# Patient Record
Sex: Female | Born: 1935 | Race: White | Hispanic: No | Smoking: Current some day smoker
Health system: Southern US, Community
[De-identification: ages and names within clinical notes are randomized; demographics above are authoritative.]

## PROBLEM LIST (undated history)

## (undated) DIAGNOSIS — I2510 Atherosclerotic heart disease of native coronary artery without angina pectoris: Secondary | ICD-10-CM

## (undated) DIAGNOSIS — R079 Chest pain, unspecified: Secondary | ICD-10-CM

## (undated) DIAGNOSIS — I10 Essential (primary) hypertension: Secondary | ICD-10-CM

## (undated) DIAGNOSIS — I480 Paroxysmal atrial fibrillation: Secondary | ICD-10-CM

## (undated) DIAGNOSIS — E46 Unspecified protein-calorie malnutrition: Secondary | ICD-10-CM

## (undated) DIAGNOSIS — I739 Peripheral vascular disease, unspecified: Secondary | ICD-10-CM

## (undated) DIAGNOSIS — Z720 Tobacco use: Secondary | ICD-10-CM

## (undated) DIAGNOSIS — K559 Vascular disorder of intestine, unspecified: Secondary | ICD-10-CM

## (undated) DIAGNOSIS — J4489 Other specified chronic obstructive pulmonary disease: Secondary | ICD-10-CM

## (undated) DIAGNOSIS — J449 Chronic obstructive pulmonary disease, unspecified: Secondary | ICD-10-CM

## (undated) HISTORY — PX: ABDOMINAL HYSTERECTOMY: SHX81

## (undated) HISTORY — DX: Peripheral vascular disease, unspecified: I73.9

## (undated) HISTORY — PX: OTHER SURGICAL HISTORY: SHX169

## (undated) HISTORY — DX: Tobacco use: Z72.0

## (undated) HISTORY — DX: Unspecified protein-calorie malnutrition: E46

## (undated) HISTORY — DX: Chronic obstructive pulmonary disease, unspecified: J44.9

## (undated) HISTORY — DX: Chest pain, unspecified: R07.9

## (undated) HISTORY — DX: Vascular disorder of intestine, unspecified: K55.9

## (undated) HISTORY — DX: Essential (primary) hypertension: I10

## (undated) HISTORY — DX: Other specified chronic obstructive pulmonary disease: J44.89

## (undated) HISTORY — DX: Paroxysmal atrial fibrillation: I48.0

## (undated) HISTORY — DX: Atherosclerotic heart disease of native coronary artery without angina pectoris: I25.10

## (undated) HISTORY — PX: APPENDECTOMY: SHX54

---

## 2008-09-27 ENCOUNTER — Encounter: Payer: Self-pay | Admitting: Cardiology

## 2008-10-01 ENCOUNTER — Encounter: Payer: Self-pay | Admitting: Cardiology

## 2008-10-03 ENCOUNTER — Ambulatory Visit: Payer: Self-pay | Admitting: Cardiology

## 2008-10-03 ENCOUNTER — Ambulatory Visit: Payer: Self-pay | Admitting: Cardiovascular Disease

## 2008-10-03 ENCOUNTER — Inpatient Hospital Stay (HOSPITAL_COMMUNITY): Admission: EM | Admit: 2008-10-03 | Discharge: 2008-10-04 | Payer: Self-pay | Admitting: Emergency Medicine

## 2008-10-03 DIAGNOSIS — I7389 Other specified peripheral vascular diseases: Secondary | ICD-10-CM

## 2008-10-03 DIAGNOSIS — R042 Hemoptysis: Secondary | ICD-10-CM | POA: Insufficient documentation

## 2008-10-03 DIAGNOSIS — R1311 Dysphagia, oral phase: Secondary | ICD-10-CM

## 2008-10-03 DIAGNOSIS — J449 Chronic obstructive pulmonary disease, unspecified: Secondary | ICD-10-CM

## 2008-10-03 DIAGNOSIS — J4489 Other specified chronic obstructive pulmonary disease: Secondary | ICD-10-CM | POA: Insufficient documentation

## 2008-10-03 DIAGNOSIS — I10 Essential (primary) hypertension: Secondary | ICD-10-CM

## 2008-10-03 DIAGNOSIS — K559 Vascular disorder of intestine, unspecified: Secondary | ICD-10-CM | POA: Insufficient documentation

## 2008-10-03 DIAGNOSIS — Z8679 Personal history of other diseases of the circulatory system: Secondary | ICD-10-CM | POA: Insufficient documentation

## 2008-10-03 DIAGNOSIS — F17200 Nicotine dependence, unspecified, uncomplicated: Secondary | ICD-10-CM | POA: Insufficient documentation

## 2008-10-03 DIAGNOSIS — R072 Precordial pain: Secondary | ICD-10-CM | POA: Insufficient documentation

## 2008-10-03 DIAGNOSIS — I200 Unstable angina: Secondary | ICD-10-CM

## 2008-10-04 ENCOUNTER — Encounter: Payer: Self-pay | Admitting: Cardiology

## 2008-10-11 ENCOUNTER — Ambulatory Visit: Payer: Self-pay | Admitting: Physician Assistant

## 2008-10-11 DIAGNOSIS — I2510 Atherosclerotic heart disease of native coronary artery without angina pectoris: Secondary | ICD-10-CM | POA: Insufficient documentation

## 2008-10-11 DIAGNOSIS — E785 Hyperlipidemia, unspecified: Secondary | ICD-10-CM | POA: Insufficient documentation

## 2008-10-16 ENCOUNTER — Ambulatory Visit: Payer: Self-pay | Admitting: Cardiology

## 2008-11-11 ENCOUNTER — Ambulatory Visit: Payer: Self-pay | Admitting: Cardiology

## 2008-11-20 ENCOUNTER — Telehealth (INDEPENDENT_AMBULATORY_CARE_PROVIDER_SITE_OTHER): Payer: Self-pay | Admitting: *Deleted

## 2009-01-06 ENCOUNTER — Encounter: Payer: Self-pay | Admitting: Cardiovascular Disease

## 2009-01-07 ENCOUNTER — Ambulatory Visit: Payer: Self-pay | Admitting: Cardiovascular Disease

## 2009-01-07 ENCOUNTER — Encounter: Payer: Self-pay | Admitting: Cardiovascular Disease

## 2009-01-07 ENCOUNTER — Ambulatory Visit: Payer: Self-pay

## 2009-01-31 ENCOUNTER — Telehealth (INDEPENDENT_AMBULATORY_CARE_PROVIDER_SITE_OTHER): Payer: Self-pay | Admitting: *Deleted

## 2009-02-04 ENCOUNTER — Ambulatory Visit: Payer: Self-pay | Admitting: Cardiovascular Disease

## 2009-02-04 ENCOUNTER — Encounter: Payer: Self-pay | Admitting: Cardiovascular Disease

## 2009-04-14 ENCOUNTER — Ambulatory Visit: Payer: Self-pay | Admitting: Cardiology

## 2009-06-03 ENCOUNTER — Encounter: Payer: Self-pay | Admitting: Cardiology

## 2009-09-01 ENCOUNTER — Encounter: Payer: Self-pay | Admitting: Cardiology

## 2009-09-24 ENCOUNTER — Ambulatory Visit: Payer: Self-pay

## 2009-09-24 ENCOUNTER — Ambulatory Visit: Payer: Self-pay | Admitting: Cardiovascular Disease

## 2009-10-22 ENCOUNTER — Ambulatory Visit: Payer: Self-pay | Admitting: Cardiology

## 2009-10-22 DIAGNOSIS — R0602 Shortness of breath: Secondary | ICD-10-CM | POA: Insufficient documentation

## 2009-10-23 ENCOUNTER — Encounter: Payer: Self-pay | Admitting: Cardiology

## 2010-02-17 NOTE — Assessment & Plan Note (Signed)
Summary: [facility]   Visit Type:  Follow-up Primary Provider:  Hasanaj  CC:  No complaints.  History of Present Illness: [name] with h/o triple vessel CAD and severe PVD by angiography 9/10 who also has a history of ongoing tobacco abuse, hyperlipidemia, HTN, COPD, AAA and PAF here today for PV follow up. She sees Dr. Andee Lineman for the mangagement of her CAD. I performed a left heart cath and lower extremity angiogram in September 2009. We opted for medical management at that time of both her CAD and PVD.   She tells me today that she is doing well. Her legs are not hurting her while walking around her house. She has been using a stationary bike in her house and she is able to use this 2-3 times per day with no pain in her legs. No nocturnal cramps or rest pain. No ulcerations. Her feet have been warmer. She continues to smoke 1ppd. She does not wish to stop.   Current Medications (verified): 1)  Nitrostat 0.4 Mg Subl (Nitroglycerin) .... Dissolve One Tablet Under Tongue For Severe Chest Pain As Needed Every 5 Minutes, Not To Exceed 3 in 15 Min Time Frame 2)  Aspirin 81 Mg Tbec (Aspirin) .... Take One Tablet By Mouth Daily 3)  Benadryl 25 Mg Tabs (Diphenhydramine Hcl) .... As Needed 4)  Geritol Complete  Tabs (Iron-Vitamins) .... Take 1 Tablet By Mouth Once A Day 5)  Ensure  Liqd (Nutritional Supplements) .... 2 Cans Daily 6)  Plavix 75 Mg Tabs (Clopidogrel Bisulfate) .... Take One Tablet By Mouth Daily 7)  Isosorbide Mononitrate Cr 60 Mg Xr24h-Tab (Isosorbide Mononitrate) .... Take One Tablet By Mouth Daily 8)  Simvastatin 40 Mg Tabs (Simvastatin) .... Take One Tablet By Mouth Daily At Bedtime 9)  Metoprolol Tartrate 25 Mg Tabs (Metoprolol Tartrate) .... Take One Tablet By Mouth Twice A Day 10)  Omeprazole 40 Mg Cpdr (Omeprazole) .... Take 1 Capsule By Mouth Once A Day  Allergies (verified): No Known Drug Allergies  Past History:  Past Medical History: Paroxysmal atrial  fibrillation Hyperlipidemia HTN CAD PVD Tobacco abuse AAA  Social History: Reviewed history from 10/03/2008 and no changes required. Retired  Alcohol Use - no 1ppd tobacco No iliicit drug use  Review of Systems  The patient denies fatigue, malaise, chest pain, palpitations, shortness of breath, prolonged cough, wheezing, sleep apnea, coughing up blood, abdominal pain, blood in stool, nausea, vomiting, diarrhea, heartburn, incontinence, blood in urine, muscle weakness, joint pain, leg swelling, rash, skin lesions, headache, fainting, dizziness, depression, anxiety, enlarged lymph nodes, easy bruising or bleeding, and environmental allergies.    Vital Signs:  Patient profile:   76 year old female Height:      61.5 inches Weight:      84.50 pounds BMI:     15.76 Pulse rate:   60 / minute Pulse rhythm:   regular Resp:     18 per minute BP sitting:   128 / 64  (left arm) Cuff size:   regular  Vitals Entered By: Vikki Ports (February 04, 2009 1:32 PM)  Physical Exam  General:  General: Well developed, thin, NAD HEENT: OP clear, mucus membranes moist SKIN: warm, dry Psychiatric: Mood and affect normal Neck: No JVD, no carotid bruits Lungs:Clear bilaterally with decreased air movement bilaterally. No wheezes, rhonci, crackles CV: RRR no murmurs, gallops rubs Abdomen: soft, NT, ND, BS present Extremities: No edema, pulses non-palpable bilateral DP/PT.    EKG  Procedure date:  02/04/2009  Findings:      NSR, rate 60 bpm. LAD.   Arteriogram-Lower Extremity  Procedure date:  10/04/2008  Findings:      1.. Distal aortogram shows patent bilateral renal arteries with mild     plaque disease in both renal arteries.  There is no evidence of     distal aortic aneurysm.  There is minimal plaque disease in the     distal aorta. 2. The left common iliac artery has 70% plaque.  The left external     iliac artery has an 80% stenosis.  The left superficial femoral     artery  has a long area of 70% narrowing from the ostium to the     midportion of vessel.  There are serial 90% lesions throughout the     mid SFA and distal SFA.  The left popliteal is patent without any     significant disease.  There is three-vessel runoff into the left     foot.  The left posterior tibial artery vessel has a 90% stenosis     in the mid portion of the vessel. 3. The right common iliac artery has mild plaque disease.  The right     external artery has severe stenosis that is approximately 90%.  The     vessel is subtotally occluded by the sheath.  The right SFA has a     near total occlusion of the proximal portion.  There is a total     occlusion in the midportion of the right SFA with reconstitution of     the distal SFA via collaterals from the profunda femoral artery.     The right popliteal is patent.  There is three-vessel runoff into     the right foot.  Cardiac Cath  Procedure date:  10/04/2008  Findings:      HEMODYNAMIC FINDINGS:  Central aortic pressure 154/59.  Left ventricular pressure 152/1.  Left ventricular end-diastolic pressure 17.  Right atrial pressure 5.  Right ventricular pressure 36/0.  Right ventricular end-diastolic pressure 6.  Pulmonary artery pressure 38/6 with a mean of 20.  Pulmonary capillary wedge pressure 8-10.  Central aortic saturation 91%.  Pulmonary artery saturation 70%.  Cardiac output 5 liters per minute.  Cardiac index 3.82 liters per minute per meter squared.   ANGIOGRAPHIC FINDINGS: 1. Left main coronary artery bifurcated into the circumflex and the     LAD and had no obstructive disease. 2. The LAD is a large vessel that courses to the apex and is free of     any significant disease.  There appears to be plaque in the     proximal portion and then 40% stenosis in the mid and distal     vessel.  The LAD gives off 4 small-caliber diagonal branches.  The     first and second diagonal branches have 70% ostial stenosis, but     are  small in caliber.  The third and fourth diagonals have plaque     disease only.  There is a large septal perforator that has plaque     disease. 3. Circumflex artery is a large-caliber vessel that has 100% total     occlusion in the midportion.  The first obtuse marginal is large     caliber and has no obstructive disease.  Just after the takeoff of     the first obtuse marginal, there is total obstruction.  The AV     groove circumflex and  the second obtuse marginal filled from left-     to-left collaterals. 4. The right coronary artery is a large dominant vessel that has 50%     ostial stenosis, 80% proximal stenosis, and diffuse 90% disease     throughout the midportion of the vessel.  This culminates in a     total occlusion in the distal portion of the vessel.  The PDA and     posterolateral branches fill from left-to-right collaterals. 5. Left ventricular angiogram was performed in the RAO projection and     shows normal left ventricular systolic function with no wall motion    Impression & Recommendations:  Problem # 1:  PVD WITH CLAUDICATION (ICD-443.89) Stable with improvement in symptoms on medical therapy. Continue ASA and Plavix. Continue walking program/exercise. Smoking cessation strongly encouraged. Will repeat ABI and abdominal aortic u/s 6 months and see her back that day. She will alert [place] if her symptoms change in character or if she develops rest pain, ulcerations or blue toes.   Problem # 2:  TOBACCO ABUSE (ICD-305.1) Smoking cessation encouraged. Pt not interested in pharmacological therapy at this time.   Other Orders: EKG w/ Interpretation (93000) Arterial Duplex Lower Extremity (Arterial Duplex Low) Abdominal Aorta Duplex (Abd Aorta Duplex)  Patient Instructions: 1)  Your physician recommends that you schedule a follow-up appointment in: 6 months 2)  Your physician has requested that you have an abdominal aorta duplex. During this test, an ultrasound is used  to evaluate the aorta. Allow 30 minutes for this exam. Do not eat after midnight the day before and avoid carbonated beverages. There are no restrictions or special instructions.  To be done in 6 months 3)  Your physician has requested that you have a lower or upper extremity arterial duplex.  This test is an ultrasound of the arteries in the legs or arms.  It looks at arterial blood flow in the legs and arms.  Allow one hour for Lower and Upper Arterial scans. There are no restrictions or special instructions.  To be done in 6 months

## 2010-02-17 NOTE — Assessment & Plan Note (Signed)
Summary: 6 MO FU REMINDER   Visit Type:  Follow-up Primary Provider:  Hasanaj   History of Present Illness: the patient is a 76 year old white female with history of triple vessel coronary arteries and severe peripheral vascular disease by angiography September 2010. She has multiple risk factors including tobacco abuse, hyperlipidemia hypertension COPD AAA as well as PAF. She has severe disease in both iliac arteries and the bilateral superficial femoral arteries. There were no good percutaneous options. The patient had recent ABIs done 0.6 on the right side and 0.6 on the left side. She reports claudication but has had no worsening of her symptoms.she gets ABIs every 6 months  The patient reports no chest pain or shortness of breath. Shows no orthopnea PND palpitations or syncopethe patient was to discontinue Plavix.  Preventive Screening-Counseling & Management  Alcohol-Tobacco     Smoking Status: current     Smoking Cessation Counseling: yes     Packs/Day: 1 PPD  Current Medications (verified): 1)  Nitrostat 0.4 Mg Subl (Nitroglycerin) .... Dissolve One Tablet Under Tongue For Severe Chest Pain As Needed Every 5 Minutes, Not To Exceed 3 in 15 Min Time Frame 2)  Aspirin 81 Mg Tbec (Aspirin) .... Take One Tablet By Mouth Daily 3)  Benadryl 25 Mg Tabs (Diphenhydramine Hcl) .... As Needed 4)  Ensure  Liqd (Nutritional Supplements) .... 2 Cans Daily 5)  Isosorbide Mononitrate Cr 60 Mg Xr24h-Tab (Isosorbide Mononitrate) .... Take One Tablet By Mouth Daily 6)  Simvastatin 40 Mg Tabs (Simvastatin) .... Take One Tablet By Mouth Daily At Bedtime 7)  Metoprolol Tartrate 25 Mg Tabs (Metoprolol Tartrate) .... Take One Tablet By Mouth Twice A Day  Allergies (verified): No Known Drug Allergies  Comments:  Nurse/Medical Assistant: The patient's medication bottles and allergies were reviewed with the patient and were updated in the Medication and Allergy Lists.  Past History:  Past Surgical  History: Last updated: 10/03/2008 HYSTERECTOMY CYST REMOVAL FROM OVARY  Family History: Last updated: 10/03/2008 Mother: Family History of Diabetes:  Father: Family History of CVA or Stroke:  COPD  Social History: Last updated: 02/04/2009 Retired  Alcohol Use - no 1ppd tobacco No iliicit drug use  Risk Factors: Smoking Status: current (10/22/2009) Packs/Day: 1 PPD (10/22/2009)  Past Medical History: Paroxysmal atrial fibrillation Hyperlipidemia HTN CAD PVD-severe Tobacco abuse AAA ABISeptember 2011 0.67 on the right and 0.69 on the right duplex imaging of aorta: Normal caliber aorta and external iliac arteries with focal elevation moderate left common iliac artery stenosis right external iliac coronary artery not well seen.  Social History: Packs/Day:  1 PPD  Review of Systems       The patient complains of shortness of breath, muscle weakness, and joint pain.    Vital Signs:  Patient profile:   76 year old female Height:      61.5 inches Weight:      92 pounds Pulse rate:   65 / minute BP sitting:   116 / 69  (left arm) Cuff size:   regular  Vitals Entered By: Carlye Grippe (October 22, 2009 12:50 PM)  Physical Exam  Additional Exam:  General: frail white female smelling of tobacco head: Normocephalic and atraumatic eyes PERRLA/EOMI intact, conjunctiva and lids normal nose: No deformity or lesions mouth normal dentition, normal posterior pharynx neck: Supple, no JVD.  No masses, thyromegaly or abnormal cervical nodes. No carotid bruits lungs: Normal breath sounds bilaterally without wheezing.  Normal percussion heart: regular rate and rhythm with normal S1  and S2, no S3 or S4.  PMI is normal.  No pathological murmurs abdomen: Normal bowel sounds, abdomen is soft and nontender without masses, organomegaly or hernias noted.  No hepatosplenomegaly musculoskeletal: Back normal, normal gait muscle strength and tone normal pulsus:absent pulses dorsalis  pedis and posterior tibial pulse bilaterally, decreased capillary refill Extremities: No peripheral pitting edema neurologic: Alert and oriented x 3 skin: Intact without lesions or rashes cervical nodes: No significant adenopathy psychologic: Normal affect    EKG  Procedure date:  10/31/2009  Findings:      normal sinus rhythm left axis deviation possible old inferior wall microinfarction no acute changes.  Impression & Recommendations:  Problem # 1:  SHORTNESS OF BREATH (ICD-786.05) we'll obtain a chest x-ray particular to rule out malignancy. Her updated medication list for this problem includes:    Aspirin 81 Mg Tbec (Aspirin) .Marland Kitchen... Take one tablet by mouth daily    Metoprolol Tartrate 25 Mg Tabs (Metoprolol tartrate) .Marland Kitchen... Take one tablet by mouth twice a day  Orders: T-Chest x-ray, 2 views (71020)  Problem # 2:  CAD (ICD-414.00) patient can discontinue Plavix for now she has difficulty affording this. The following medications were removed from the medication list:    Plavix 75 Mg Tabs (Clopidogrel bisulfate) .Marland Kitchen... Take one tablet by mouth daily Her updated medication list for this problem includes:    Nitrostat 0.4 Mg Subl (Nitroglycerin) .Marland Kitchen... Dissolve one tablet under tongue for severe chest pain as needed every 5 minutes, not to exceed 3 in 15 min time frame    Aspirin 81 Mg Tbec (Aspirin) .Marland Kitchen... Take one tablet by mouth daily    Isosorbide Mononitrate Cr 60 Mg Xr24h-tab (Isosorbide mononitrate) .Marland Kitchen... Take one tablet by mouth daily    Metoprolol Tartrate 25 Mg Tabs (Metoprolol tartrate) .Marland Kitchen... Take one tablet by mouth twice a day  Orders: EKG w/ Interpretation (93000)  Problem # 3:  TOBACCO ABUSE (ICD-305.1) the patient was counseled regarding tobacco use  Problem # 4:  ABDOMINAL AORTIC ANEURYSM (ICD-441.4) patient will require followup in 6 months with an ultrasound  Problem # 5:  PVD WITH CLAUDICATION (ICD-443.89) patient has ongoing claudication but has no  good peripheral interventional options.  Patient Instructions: 1)  Chest x-ray 2)  Stop Plavix  3)  ABI's in 6 months prior to next visit 4)  Follow up in  6 months Prescriptions: ISOSORBIDE MONONITRATE CR 60 MG XR24H-TAB (ISOSORBIDE MONONITRATE) Take one tablet by mouth daily  #30 x 6   Entered by:   Hoover Brunette, LPN   Authorized by:   Lewayne Bunting, MD, [facility]   Signed by:   Hoover Brunette, LPN on 16/10/9602   Method used:   Electronically to        [facility] # [phone]* (retail)       [address]       [place], [place]  [address]       Ph: [phone] or [phone]       Fax: [phone]   RxID:   [id number] METOPROLOL TARTRATE 25 MG TABS (METOPROLOL TARTRATE) Take one tablet by mouth twice a day  #60 x 6   Entered by:   Hoover Brunette, LPN   Authorized by:   Lewayne Bunting, MD, [facility]   Signed by:   Hoover Brunette, LPN on 74/25/9563   Method used:   Electronically to        [facility] # [phone]* (  retail)       [address]       [place], [place]  [address]       Ph: [phone] or [phone]       Fax: [phone]   RxID:   [id number]

## 2010-02-17 NOTE — Medication Information (Signed)
Summary: RX Folder/ PICKED UP [facility]  RX Folder/ PICKED UP [facility]   Imported By: Dorise Hiss 09/01/2009 16:12:28  _____________________________________________________________________  External Attachment:    Type:   Image     Comment:   External Document

## 2010-02-17 NOTE — Assessment & Plan Note (Signed)
Summary: 6 MO FU   Visit Type:  Follow-up Primary Provider:  Hasanaj  CC:  NO CARDIOLOGY COMPLAINTS TODAY.  History of Present Illness: the patient is a 76 year old female with severe multivessel coronary artery disease, severe peripheral vascular disease with claudication, hypertension and dyslipidemia. She was recently evaluated her for peripheral vascular disease. Recommendations were to continue medical therapy and a graded exercise program. As a matter of fact the patient's claudication is significantly improved. She is able to her house chores and daily activities without any symptoms. She reports the 4 episodes in the last month of substernal chest pressure lasting several seconds.she reports no exertional angina.she reports short of breath on moderate exertion which is chronic likely related to her underlying COPD. Unfortunately patient continues to smoke but she got back down to half a pack a day. From a cardiovascular standpoint is otherwise stable and reports no presyncope or syncope and no orthopnea PND. Laboratory work is followed by primary care physician.  Clinical Review Panels:  CXR CXR results  IMPRESSION:   1. Triple-vessel coronary artery disease.   2. Normal left ventricular systolic function.   3. Severe peripheral vascular disease.      RECOMMENDATIONS:  I recommend medical management for the patient's   coronary artery disease and peripheral vascular disease for now.  I will   have her begin taking an aspirin as well as Plavix.  We will likely   discharge her from the hospital later today after her bedrest.  This   patient has extensive coronary artery disease, but does have good   collateralization of the distal vessels.  Her LAD is patent without any   obstructive disease.  Her left ventricular systolic function is normal   at this time.  I think that she would do well with medical management.   In regards to her peripheral vascular disease, we will have  surveillance   ultrasounds and will follow her symptomatically.  If she begins to   develop any rest pain or ulcerations, then we would consider   revascularization of her lower extremities at that time.               Verne Carrow, MD   Electronically Signed     (10/03/2008)  Vascular Studies ABI's Impressions: Known multi-level arterial disease. The bilateral ABI's are in the moderate range.  Tonny Bollman, MD (01/07/2009)  Cardiac Imaging Cardiac Cath Findings  IMPRESSION:   1. Triple-vessel coronary artery disease.   2. Normal left ventricular systolic function.   3. Severe peripheral vascular disease.      RECOMMENDATIONS:  I recommend medical management for the patient's   coronary artery disease and peripheral vascular disease for now.  I will   have her begin taking an aspirin as well as Plavix.  We will likely   discharge her from the hospital later today after her bedrest.  This   patient has extensive coronary artery disease, but does have good   collateralization of the distal vessels.  Her LAD is patent without any   obstructive disease.  Her left ventricular systolic function is normal   at this time.  I think that she would do well with medical management.   In regards to her peripheral vascular disease, we will have surveillance   ultrasounds and will follow her symptomatically.  If she begins to   develop any rest pain or ulcerations, then we would consider   revascularization of her lower extremities at that time.  Verne Carrow, MD   Electronically Signed     (10/04/2008)    Current Medications (verified): 1)  Nitrostat 0.4 Mg Subl (Nitroglycerin) .... Dissolve One Tablet Under Tongue For Severe Chest Pain As Needed Every 5 Minutes, Not To Exceed 3 in 15 Min Time Frame 2)  Aspirin 81 Mg Tbec (Aspirin) .... Take One Tablet By Mouth Daily 3)  Benadryl 25 Mg Tabs (Diphenhydramine Hcl) .... As Needed 4)  Ensure  Liqd  (Nutritional Supplements) .... 2 Cans Daily 5)  Plavix 75 Mg Tabs (Clopidogrel Bisulfate) .... Take One Tablet By Mouth Daily 6)  Isosorbide Mononitrate Cr 60 Mg Xr24h-Tab (Isosorbide Mononitrate) .... Take One Tablet By Mouth Daily 7)  Simvastatin 40 Mg Tabs (Simvastatin) .... Take One Tablet By Mouth Daily At Bedtime 8)  Metoprolol Tartrate 25 Mg Tabs (Metoprolol Tartrate) .... Take One Tablet By Mouth Twice A Day 9)  Omeprazole 40 Mg Cpdr (Omeprazole) .... Take 1 Capsule By Mouth Once A Day  Allergies (verified): No Known Drug Allergies  Past History:  Past Surgical History: Last updated: 10/03/2008 HYSTERECTOMY CYST REMOVAL FROM OVARY  Family History: Last updated: 10/03/2008 Mother: Family History of Diabetes:  Father: Family History of CVA or Stroke:  COPD  Social History: Last updated: 02/04/2009 Retired  Alcohol Use - no 1ppd tobacco No iliicit drug use  Risk Factors: Smoking Status: current (11/11/2008) Packs/Day: 1/2 PPD (11/11/2008)  Past Medical History: Reviewed history from 02/04/2009 and no changes required. Paroxysmal atrial fibrillation Hyperlipidemia HTN CAD PVD Tobacco abuse AAA  Review of Systems       The patient complains of shortness of breath and prolonged cough.  The patient denies fatigue, malaise, fever, weight gain/loss, vision loss, decreased hearing, hoarseness, chest pain, palpitations, wheezing, sleep apnea, coughing up blood, abdominal pain, blood in stool, nausea, vomiting, diarrhea, heartburn, incontinence, blood in urine, muscle weakness, joint pain, leg swelling, rash, skin lesions, headache, fainting, dizziness, depression, anxiety, enlarged lymph nodes, easy bruising or bleeding, and environmental allergies.    Vital Signs:  Patient profile:   76 year old female Weight:      89 pounds Pulse rate:   62 / minute BP sitting:   136 / 72  (left arm)  Vitals Entered By: Dreama Saa, CNA (April 14, 2009 8:50 AM)  Physical  Exam  Additional Exam:  General: frail white female smelling of tobacco head: Normocephalic and atraumatic eyes PERRLA/EOMI intact, conjunctiva and lids normal nose: No deformity or lesions mouth normal dentition, normal posterior pharynx neck: Supple, no JVD.  No masses, thyromegaly or abnormal cervical nodes. No carotid bruits lungs: Normal breath sounds bilaterally without wheezing.  Normal percussion heart: regular rate and rhythm with normal S1 and S2, no S3 or S4.  PMI is normal.  No pathological murmurs abdomen: Normal bowel sounds, abdomen is soft and nontender without masses, organomegaly or hernias noted.  No hepatosplenomegaly musculoskeletal: Back normal, normal gait muscle strength and tone normal pulsus:absent pulses dorsalis pedis and posterior tibial pulse bilaterally, decreased capillary refill Extremities: No peripheral pitting edema neurologic: Alert and oriented x 3 skin: Intact without lesions or rashes cervical nodes: No significant adenopathy psychologic: Normal affect    Impression & Recommendations:  Problem # 1:  TOBACCO ABUSE (ICD-305.1) I counseled the patient extensively regarding her tobacco use. I proposed Chantix which is unable to afford this. Again suggested to her to buy an electronic cigarette and her daughter state that she will look into that. The patient has cut down her smoking  to half a pack a day in the meanwhile.  Problem # 2:  PVD WITH CLAUDICATION (ICD-443.89) the patient is followed by Dr. Sanjuana Kava. Further medical therapy I suggested for her PVD. The patient uses a stationary bicycle and has had significant improvement in her symptoms of claudication or  Problem # 3:  CAD (ICD-414.00) stable no recurrent chest pain. Her updated medication list for this problem includes:    Nitrostat 0.4 Mg Subl (Nitroglycerin) .Marland Kitchen... Dissolve one tablet under tongue for severe chest pain as needed every 5 minutes, not to exceed 3 in 15 min time frame     Aspirin 81 Mg Tbec (Aspirin) .Marland Kitchen... Take one tablet by mouth daily    Plavix 75 Mg Tabs (Clopidogrel bisulfate) .Marland Kitchen... Take one tablet by mouth daily    Isosorbide Mononitrate Cr 60 Mg Xr24h-tab (Isosorbide mononitrate) .Marland Kitchen... Take one tablet by mouth daily    Metoprolol Tartrate 25 Mg Tabs (Metoprolol tartrate) .Marland Kitchen... Take one tablet by mouth twice a day  Problem # 4:  COPD (ICD-496) the patient was again counseled to discontinue tobacco use.  Problem # 5:  ATRIAL FIBRILLATION, PAROXYSMAL, HX OF (ICD-V12.50) no recurrent arrhythmias.  Problem # 6:  CAD (ICD-414.00) the patient reports several episodes of atypical chest pain lasting seconds. Clinically does not appear to represent angina. Continue risk factor modification. I gave the patient refills on isosorbide mononitrate and metoprolol. Her updated medication list for this problem includes:    Nitrostat 0.4 Mg Subl (Nitroglycerin) .Marland Kitchen... Dissolve one tablet under tongue for severe chest pain as needed every 5 minutes, not to exceed 3 in 15 min time frame    Aspirin 81 Mg Tbec (Aspirin) .Marland Kitchen... Take one tablet by mouth daily    Plavix 75 Mg Tabs (Clopidogrel bisulfate) .Marland Kitchen... Take one tablet by mouth daily    Isosorbide Mononitrate Cr 60 Mg Xr24h-tab (Isosorbide mononitrate) .Marland Kitchen... Take one tablet by mouth daily    Metoprolol Tartrate 25 Mg Tabs (Metoprolol tartrate) .Marland Kitchen... Take one tablet by mouth twice a day  Patient Instructions: 1)  Your physician recommends that you continue on your current medications as directed. Please refer to the Current Medication list given to you today. 2)  Follow up in  6 months Prescriptions: METOPROLOL TARTRATE 25 MG TABS (METOPROLOL TARTRATE) Take one tablet by mouth twice a day  #60 x 6   Entered by:   Hoover Brunette, LPN   Authorized by:   Lewayne Bunting, MD, [facility]   Signed by:   Hoover Brunette, LPN on 16/10/9602   Method used:   Electronically to        [facility]  [address] [address] [id number] (retail[facility]       [address]        [place], [place]  [address]       Ph: [phone] or [phone]       Fax: [phone]   RxID:   [id number] ISOSORBIDE MONONITRATE CR 60 MG XR24H-TAB (ISOSORBIDE MONONITRATE) Take one tablet by mouth daily  #30 x 6   Entered by:   Hoover Brunette, LPN   Authorized by:   Lewayne Bunting, MD, [facility]   Signed by:   Hoover Brunette, LPN on 40/34/7425   Method used:   [facility]        [facility]  [address] [address] [id number] ([facility]ail[facility]       [address]       [place], [place]  [address]       Ph: [phone] or [phone]       Fax: [phone]   RxID:   [id number]

## 2010-02-17 NOTE — Progress Notes (Signed)
  Recieved Request from [facility] requesting Records forwarded to [facility] for processing. Jacqueline Briggs  January 31, 2009 1:08 PM

## 2010-02-17 NOTE — Medication Information (Signed)
Summary: RX Folder/ PICKED UP PLAVIX  RX Folder/ PICKED UP PLAVIX   Imported By: Dorise Hiss 06/10/2009 12:29:56  _____________________________________________________________________  External Attachment:    Type:   Image     Comment:   External Document

## 2010-02-17 NOTE — Assessment & Plan Note (Signed)
Summary: per check out/pt also need aorta/aterial same day/saf   Visit Type:  Follow-up Primary Jacqueline Briggs:  Hasanaj  CC:  No complains.  History of Present Illness: [name] with h/o triple vessel CAD and severe PVD by angiography 9/10 who also has a history of ongoing tobacco abuse, hyperlipidemia, HTN, COPD, AAA and PAF here today for PV follow up. She sees Dr. Andee Lineman for the mangagement of her CAD. I performed a left heart cath and lower extremity angiogram in September 2010. She was found to have severe disease in both iliac arteries and the bilateral superficial femoral arteries. There were no good percutaneous options at that time.   We opted for medical management at that time of both her CAD and PVD. She tells me today that she is doing well. Her legs are not hurting her while walking around her house. She has been using a stationary bike in her house and she is able to use this 2-3 times per day with no pain in her legs. No nocturnal cramps or rest pain. No ulcerations. Her feet are cold most of the time but not painful. She continues to smoke 1ppd. She wishes to stop but cannot. She has tried multiple times.   Current Medications (verified): 1)  Nitrostat 0.4 Mg Subl (Nitroglycerin) .... Dissolve One Tablet Under Tongue For Severe Chest Pain As Needed Every 5 Minutes, Not To Exceed 3 in 15 Min Time Frame 2)  Aspirin 81 Mg Tbec (Aspirin) .... Take One Tablet By Mouth Daily 3)  Benadryl 25 Mg Tabs (Diphenhydramine Hcl) .... As Needed 4)  Ensure  Liqd (Nutritional Supplements) .... 2 Cans Daily 5)  Plavix 75 Mg Tabs (Clopidogrel Bisulfate) .... Take One Tablet By Mouth Daily 6)  Isosorbide Mononitrate Cr 60 Mg Xr24h-Tab (Isosorbide Mononitrate) .... Take One Tablet By Mouth Daily 7)  Simvastatin 40 Mg Tabs (Simvastatin) .... Take One Tablet By Mouth Daily At Bedtime 8)  Metoprolol Tartrate 25 Mg Tabs (Metoprolol Tartrate) .... Take One Tablet By Mouth Twice A Day  Allergies (verified): No  Known Drug Allergies  Past History:  Past Medical History: Paroxysmal atrial fibrillation Hyperlipidemia HTN CAD PVD-severe Tobacco abuse AAA  Social History: Reviewed history from 02/04/2009 and no changes required. Retired  Alcohol Use - no 1ppd tobacco No iliicit drug use  Review of Systems  The patient denies fatigue, malaise, fever, weight gain/loss, vision loss, decreased hearing, hoarseness, chest pain, palpitations, shortness of breath, prolonged cough, wheezing, sleep apnea, coughing up blood, abdominal pain, blood in stool, nausea, vomiting, diarrhea, heartburn, incontinence, blood in urine, muscle weakness, joint pain, leg swelling, rash, skin lesions, headache, fainting, dizziness, depression, anxiety, enlarged lymph nodes, easy bruising or bleeding, and environmental allergies.         Bilateral feet are cold. See HPI  Vital Signs:  Patient profile:   76 year old female Height:      61.5 inches Weight:      92.50 pounds BMI:     17.26 Pulse rate:   64 / minute Pulse rhythm:   regular Resp:     18 per minute BP sitting:   149 / 77  (left arm) Cuff size:   small  Vitals Entered By: Vikki Ports (September 24, 2009 10:40 AM)  Physical Exam  General:  General: Well developed, thin, NAD HEENT: OP clear, mucus membranes moist SKIN: warm, dry Psychiatric: Mood and affect normal Neck: No JVD, no carotid bruits Lungs:Clear bilaterally with decreased air movement bilaterally. No  wheezes, rhonci, crackles CV: RRR no murmurs, gallops rubs Abdomen: soft, NT, ND, BS present Extremities: No edema, pulses non-palpable bilateral DP/PT. Feet are cool to touch. No ulcerations.    Arteriogram-Lower Extremity  Procedure date:  10/04/2008  Findings:      Findings:      1. Distal aortogram shows patent bilateral renal arteries with mild     plaque disease in both renal arteries.  There is no evidence of     distal aortic aneurysm.  There is minimal plaque disease  in the     distal aorta. 2. The left common iliac artery has 70% plaque.  The left external     iliac artery has an 80% stenosis.  The left superficial femoral     artery has a long area of 70% narrowing from the ostium to the     midportion of vessel.  There are serial 90% lesions throughout the     mid SFA and distal SFA.  The left popliteal is patent without any     significant disease.  There is three-vessel runoff into the left     foot.  The left posterior tibial artery vessel has a 90% stenosis     in the mid portion of the vessel. 3. The right common iliac artery has mild plaque disease.  The right     external artery has severe stenosis that is approximately 90%.  The     vessel is subtotally occluded by the sheath.  The right SFA has a     near total occlusion of the proximal portion.  There is a total     occlusion in the midportion of the right SFA with reconstitution of     the distal SFA via collaterals from the profunda femoral artery.     The right popliteal is patent.  There is three-vessel runoff into     the right foot.  EKG  Procedure date:  09/24/2009  Findings:      NSR, rate 66 bpm. Biatrial enlargement. LAD. Possible old inferior infarct.   ABI's  Procedure date:  09/24/2009  Findings:      Right 0.67, Left 0.69.  Normal caliber aorta.  Impression & Recommendations:  Problem # 1:  PVD WITH CLAUDICATION (ICD-443.89) She has severe disease in both iliacs and both superficial femoral arteries. She continues to smoke. She has no claudication despite the fact that she has severe disease in both legs. If she develops rest pain or ulcerations on her lower extremities that are non-healing, will need surgical consultation. ABI today stable. No abdominal aortic pathology. Will continue ASA and Plavix for now. She wishes to follow up with Dr. Andee Lineman and does not wish to drive to [facility] for her PV visits. I have told her that I will communicate this with  Dr.DeGent. She will need ABI every 6 months. This can be done at [facility] and followed by Dr. Andee Lineman. If significant change, I can see her and discuss options.   Other Orders: EKG w/ Interpretation (93000)  Patient Instructions: 1)  Your physician recommends that you schedule a follow-up appointment as needed. 2)  Your physician recommends that you continue on your current medications as directed. Please refer to the Current Medication list given to you today. 3)  Your physician has requested that you have an ankle brachial index (ABI) in 6 months at [facility]. During this test an ultrasound and blood pressure cuff are used to evaluate the arteries that supply  the arms and legs with blood. Allow thirty minutes for this exam. There are no restrictions or special instructions. 4)  Follow-up with Dr. Andee Lineman in [place] as scheduled. Prescriptions: SIMVASTATIN 40 MG TABS (SIMVASTATIN) Take one tablet by mouth daily at bedtime  #30 x 12   Entered by:   Whitney Maeola Sarah RN   Authorized by:   Verne Carrow, MD   Signed by:   Ellender Hose RN on 09/24/2009   Method used:   Electronically to        [phone]* (retail)       [address]       [place], [place]  [address]       Ph: [phone] or [phone]       Fax: [phone]   RxID:   [id number]

## 2010-04-02 ENCOUNTER — Encounter: Payer: Self-pay | Admitting: Physician Assistant

## 2010-04-02 ENCOUNTER — Encounter (INDEPENDENT_AMBULATORY_CARE_PROVIDER_SITE_OTHER): Payer: Self-pay | Admitting: *Deleted

## 2010-04-02 DIAGNOSIS — I739 Peripheral vascular disease, unspecified: Secondary | ICD-10-CM | POA: Insufficient documentation

## 2010-04-07 NOTE — Miscellaneous (Signed)
Summary: Orders Update  Clinical Lists Changes  Problems: Added new problem of PVD (ICD-443.9) Orders: Added new Referral order of ABI (ABI) - Signed

## 2010-04-07 NOTE — Letter (Signed)
Summary: Generic Engineer, agricultural at [facility] [address]   [place], [place] [address]   Phone: [phone]  Fax: [phone]       04/02/2010  JANAYLA MARIK [address] [place], [place]  [address]  Dear Ms. Dewaine Conger,  MWe have enclosed a copy of your order  that your cardiologist Requested you to have. If the date and time are not suitable For you please call our office at   [phone] ext [phone] And I will be happy to re-schedule for you.RN: [id number] MRN: [id number]         Sincerely,   Zachary George Patient Care Coordinator

## 2010-04-24 LAB — PROTIME-INR
INR: 1 (ref 0.00–1.49)
Prothrombin Time: 13.4 seconds (ref 11.6–15.2)

## 2010-04-24 LAB — CK TOTAL AND CKMB (NOT AT ARMC)
CK, MB: 1.1 ng/mL (ref 0.3–4.0)
Total CK: 48 U/L (ref 7–177)

## 2010-04-24 LAB — POCT I-STAT 3, ART BLOOD GAS (G3+)
Bicarbonate: 29 mEq/L — ABNORMAL HIGH (ref 20.0–24.0)
TCO2: 30 mmol/L (ref 0–100)
pH, Arterial: 7.423 — ABNORMAL HIGH (ref 7.350–7.400)
pO2, Arterial: 61 mmHg — ABNORMAL LOW (ref 80.0–100.0)

## 2010-04-24 LAB — COMPREHENSIVE METABOLIC PANEL
Albumin: 3.4 g/dL — ABNORMAL LOW (ref 3.5–5.2)
BUN: 9 mg/dL (ref 6–23)
Creatinine, Ser: 0.64 mg/dL (ref 0.4–1.2)
Total Bilirubin: 0.3 mg/dL (ref 0.3–1.2)
Total Protein: 6.7 g/dL (ref 6.0–8.3)

## 2010-04-24 LAB — POCT I-STAT 3, VENOUS BLOOD GAS (G3P V)
Acid-Base Excess: 6 mmol/L — ABNORMAL HIGH (ref 0.0–2.0)
Bicarbonate: 31.2 mEq/L — ABNORMAL HIGH (ref 20.0–24.0)
O2 Saturation: 70 %
TCO2: 33 mmol/L (ref 0–100)

## 2010-04-24 LAB — CARDIAC PANEL(CRET KIN+CKTOT+MB+TROPI)
Relative Index: INVALID (ref 0.0–2.5)
Troponin I: 0.01 ng/mL (ref 0.00–0.06)

## 2010-04-24 LAB — CBC
Platelets: 213 10*3/uL (ref 150–400)
RDW: 13.2 % (ref 11.5–15.5)

## 2010-04-24 LAB — APTT: aPTT: 29 seconds (ref 24–37)

## 2010-05-20 ENCOUNTER — Encounter: Payer: Self-pay | Admitting: Cardiology

## 2010-05-20 ENCOUNTER — Ambulatory Visit (INDEPENDENT_AMBULATORY_CARE_PROVIDER_SITE_OTHER): Payer: Medicare Other | Admitting: Cardiology

## 2010-05-20 VITALS — BP 165/76 | HR 55 | Ht 62.0 in | Wt 90.8 lb

## 2010-05-20 DIAGNOSIS — E785 Hyperlipidemia, unspecified: Secondary | ICD-10-CM

## 2010-05-20 DIAGNOSIS — I10 Essential (primary) hypertension: Secondary | ICD-10-CM

## 2010-05-20 DIAGNOSIS — I7389 Other specified peripheral vascular diseases: Secondary | ICD-10-CM

## 2010-05-20 DIAGNOSIS — I714 Abdominal aortic aneurysm, without rupture, unspecified: Secondary | ICD-10-CM

## 2010-05-20 DIAGNOSIS — Z8679 Personal history of other diseases of the circulatory system: Secondary | ICD-10-CM

## 2010-05-20 DIAGNOSIS — R0602 Shortness of breath: Secondary | ICD-10-CM

## 2010-05-20 DIAGNOSIS — I2510 Atherosclerotic heart disease of native coronary artery without angina pectoris: Secondary | ICD-10-CM

## 2010-05-20 MED ORDER — METOPROLOL TARTRATE 25 MG PO TABS: 25.0000 mg | ORAL_TABLET | Freq: Two times a day (BID) | ORAL | Status: DC

## 2010-05-20 MED ORDER — ISOSORBIDE MONONITRATE ER 60 MG PO TB24: 60.0000 mg | ORAL_TABLET | Freq: Every day | ORAL | Status: DC

## 2010-05-20 NOTE — Assessment & Plan Note (Addendum)
PVD-severe , 90% stenosis of the external iliac artery on the right side. The right SFA has near total occlusion of the proximal portion. There is total occlusion in midportion of the right SFA with reconstitution of the distal SFA via collaterals from the profunda femoral artery. The right popliteal artery is patent the vessel runoff into the right foot. The patient is apparently scheduled next month for ABIs here at [facility] brought in our [place] office. I told the patient we will follow up on those results. At least clinically she appears to have improved. ABISeptember 2011 0.67 on the right and 0.69 on the right improvement in symptoms. The patient is able to walk a little bit more without any pain in the lower extremities. duplex imaging of aorta: Normal caliber aorta and external iliac arteries with focal elevation moderate left common iliac artery stenosis right external iliac coronary artery not well seen.Recommendations at time of catheterization were to follow up surveillance ultrasound and only to consider intervention to the lower extremities if any rest pain or ulcerations.

## 2010-05-20 NOTE — Patient Instructions (Signed)
   Refills sent to pharmacy for Metoprolol & Isosorbide  Bring blood pressure readings to office Your physician wants you to follow up in: 6 months.  You will receive a reminder letter in the mail one-two months in advance.  If you don't receive a letter, please call our office to schedule the follow up appointment

## 2010-05-20 NOTE — Assessment & Plan Note (Signed)
Apparent normal sinus rhythm. The diagnosis of paroxysmal atrial fibrillation is somewhat unclear. Therefore I have not recommended Coumadin during this office visit.

## 2010-05-20 NOTE — Assessment & Plan Note (Signed)
Blood pressure is poorly controlled at least during this office visit. The patient has a blood pressure machine at home but does not know how to work. Her daughter states that they will try to take care of this and she will follow her blood pressure and give [place] additional blood pressure readings so we can make a decision if the patient needs additional therapy.

## 2010-05-20 NOTE — Assessment & Plan Note (Signed)
No evidence of abdominal aortic aneurysm by recent ultrasound

## 2010-05-20 NOTE — Assessment & Plan Note (Signed)
Patient is a statin drug therapy.

## 2010-05-20 NOTE — Progress Notes (Signed)
HPI The patient is a 76 year old female with a history of multivessel coronary disease and severe peripheral vascular disease by angiography in September of 2010. She has multiple cardiac risk factors including tobacco use, hyperlipidemia, hypertension as well as a prior history of COPD, AAA and paroxysmal atrial fibrillation. She has severe disease in both iliac arteries and the bilateral superficial femoral arteries. She was evaluated previously and there were felt to be no good percutaneous options. Last year the patient had ABIs done which were 0.6 both on the left and right side. She reports symptoms of chronic claudication and is followed with ABI studies typically done in our office in [place]. Her last study was done in September 2011. There was a normal caliber abdominal aorta as well as common and external iliac arteries without focal dilatation. There was moderate left common iliac artery stenosis the right external iliac artery was not well seen. The patient had a chest x-ray done last year which showed COPD and emphysema but no malignancy The patient denies any chest pain orthopnea or PND. Overall she's relatively weak and has poor exercise tolerance. Unfortunately she continues to smoke. She states that her legs give out very quickly without any frank claudication. She quotes that she is able to start around the house a lot more than she used to. She did not have to take any nitroglycerin recently. She does feel that her feet stay cold most of the time.  No Known Allergies  No current outpatient prescriptions on file prior to visit.    Past Medical History  Diagnosis Date  . Chest pain, unspecified   . Chronic airway obstruction, not elsewhere classified   . Unspecified essential hypertension   . Paroxysmal atrial fibrillation   . Unspecified protein-calorie malnutrition   . CAD (coronary artery disease)   . PVD (peripheral vascular disease)     SEVERE  . Tobacco abuse   . AAA  (abdominal aortic aneurysm)   . Unspecified vascular insufficiency of intestine   . Precordial pain   . Intermediate coronary syndrome     Past Surgical History  Procedure Date  . Abdominal hysterectomy   . Cyst removal from ovary   . Appendectomy     No family history on file.  History   Social History  . Marital Status: Widowed    Spouse Name: N/A    Number of Children: N/A  . Years of Education: N/A   Occupational History  . RETIRED    Social History Main Topics  . Smoking status: Current Everyday Smoker -- 1.0 packs/day for 56 years    Types: Cigarettes  . Smokeless tobacco: Never Used  . Alcohol Use: No  . Drug Use: No  . Sexually Active: Not on file   Other Topics Concern  . Not on file   Social History Narrative  . No narrative on file   review of systems:Pertinent positives as outlined above. The remainder of the 18  point review of systems is negative   PHYSICAL EXAM BP 165/76  Pulse 55  Ht 5\' 2"  (1.575 m)  Wt 90 lb 12 oz (41.164 kg)  BMI 16.60 kg/m2 General: Poorly nourished somewhat cachectic appearing  Head: Normocephalic and atraumatic Eyes:PERRLA/EOMI intact, conjunctiva and lids normal Ears: No deformity or lesions Mouth:normal dentition, normal posterior pharynx Neck: Supple, no JVD.  No masses, thyromegaly or abnormal cervical nodes Lungs: Markedly diminished breath sounds on exam without wheezing.  Normal percussion Cardiac: regular rate and rhythm with normal S1  and S2, no S3 or S4.  PMI is normal.  No pathological murmurs Abdomen: Normal bowel sounds, abdomen is soft and nontender without masses, organomegaly or hernias noted.  No hepatosplenomegaly MSK: Back normal, normal gait muscle strength and tone normal Vascular: Dorsalis pedis and posterior tibial pulses are not palpable bilaterally Extremities: No peripheral pitting edema Neurologic: Alert and oriented x 3 Skin: Intact without lesions or rashes Lymphatics: No significant  adenopathy Psychologic: Normal affect   ECG:  ASSESSMENT AND PLAN

## 2010-05-20 NOTE — Assessment & Plan Note (Signed)
CAD: Cardiac catheterization done September 2010: Triple-vessel coronary artery disease with normal LV systolic function and severe peripheral vascular disease. Medical therapy was recommended. Because there was good collateralization of the distal vessels. The LAD was felt to be patent without any obstructive disease. Plavix was discontinued last year. Continue medical therapy.

## 2010-05-26 ENCOUNTER — Encounter: Payer: Self-pay | Admitting: Physician Assistant

## 2010-05-27 ENCOUNTER — Telehealth: Payer: Self-pay | Admitting: *Deleted

## 2010-05-27 NOTE — Telephone Encounter (Signed)
Message copied by Hoover Brunette on Wed May 27, 2010  4:53 PM ------      Message from: Prescott Parma      Created: Wed May 27, 2010 10:54 AM       Pt has known, severe PAD...current study suggests no sig change on R, and perhaps some mild improvement on L. Continue medical Rx, and walking program.

## 2010-05-27 NOTE — Telephone Encounter (Signed)
Patient notified of results and verbalized understanding.  

## 2010-06-02 ENCOUNTER — Ambulatory Visit: Payer: Self-pay | Admitting: Cardiology

## 2010-10-08 ENCOUNTER — Other Ambulatory Visit: Payer: Self-pay | Admitting: *Deleted

## 2010-10-08 MED ORDER — SIMVASTATIN 40 MG PO TABS: 40.0000 mg | ORAL_TABLET | Freq: Every day | ORAL | Status: DC

## 2010-11-18 ENCOUNTER — Encounter: Payer: Self-pay | Admitting: Physician Assistant

## 2010-11-18 ENCOUNTER — Ambulatory Visit (INDEPENDENT_AMBULATORY_CARE_PROVIDER_SITE_OTHER): Payer: Medicare Other | Admitting: Physician Assistant

## 2010-11-18 VITALS — BP 126/75 | HR 74 | Ht 62.0 in | Wt 103.0 lb

## 2010-11-18 DIAGNOSIS — I4891 Unspecified atrial fibrillation: Secondary | ICD-10-CM

## 2010-11-18 DIAGNOSIS — Z8679 Personal history of other diseases of the circulatory system: Secondary | ICD-10-CM

## 2010-11-18 DIAGNOSIS — I2510 Atherosclerotic heart disease of native coronary artery without angina pectoris: Secondary | ICD-10-CM

## 2010-11-18 DIAGNOSIS — E782 Mixed hyperlipidemia: Secondary | ICD-10-CM

## 2010-11-18 DIAGNOSIS — I10 Essential (primary) hypertension: Secondary | ICD-10-CM

## 2010-11-18 MED ORDER — NITROGLYCERIN 0.4 MG SL SUBL: 0.4000 mg | SUBLINGUAL_TABLET | SUBLINGUAL | Status: AC | PRN

## 2010-11-18 MED ORDER — METOPROLOL TARTRATE 25 MG PO TABS: 25.0000 mg | ORAL_TABLET | Freq: Two times a day (BID) | ORAL | Status: DC

## 2010-11-18 MED ORDER — ASPIRIN 81 MG PO TABS: ORAL_TABLET | ORAL | Status: DC

## 2010-11-18 MED ORDER — ISOSORBIDE MONONITRATE ER 60 MG PO TB24: 60.0000 mg | ORAL_TABLET | Freq: Every day | ORAL | Status: DC

## 2010-11-18 NOTE — Assessment & Plan Note (Signed)
Will reassess lipid status with a FLP/LFT profile. Aggressive management recommended with target LDL 70 or less, if feasible. Continue current dose of simvastatin, pending review.

## 2010-11-18 NOTE — Patient Instructions (Signed)
   [phone] Your physician recommends that you go to the [facility] for lab work for FLP & LFT (cholesterol).  Reminder:  Nothing to eat or drink after 12 midnight prior to labs.  If the results of your test are normal or stable, you will receive a letter.  If they are abnormal, the nurse will contact you by phone. Walk for exercise Your physician wants you to follow up in: 6 months.  You will receive a reminder letter in the mail one-two months in advance.  If you don't receive a letter, please call our office to schedule the follow up appointment

## 2010-11-18 NOTE — Progress Notes (Signed)
HPI: Patient presents for six-month followup.  She denies interim development of exertional CP. She continues to experience DOE; however, she continues to smoke, approximately 1 PPD/day. With her known PAD, she walks when she can, including using an exercycle at home.  Most recent LE arterial Dopplers, 5/12: 0.65 right; 0.78 left, consistent with severe aortoiliac disease.  Regarding her history of PAF, she has occasional palpitations, of brief duration and essentially asymptomatic. She is currently in NSR, and is on ASA 162 mg daily. Dr. Andee Lineman previously noted that her diagnoses of PAF was somewhat unclear and, therefore, chose not to treat her with Coumadin.  PMH: reviewed and listed in Problem List in electronic Records (and see below)  Allergies/SH/FH: available in Electronic Records for review  Current Outpatient Prescriptions on File Prior to Visit  Medication Sig Dispense Refill  . omeprazole (PRILOSEC) 40 MG capsule Take 40 mg by mouth daily as needed.       . simvastatin (ZOCOR) 40 MG tablet Take 1 tablet (40 mg total) by mouth at bedtime.  30 tablet  6    ROS: Negative for exertional chest pain, orthopnea, PND, lower extremity edema, presyncope/syncope, claudication, reflux, hematuria, hematochezia, or melena. Remaining systems reviewed, and are negative.   PHYSICAL EXAM:  BP 126/75  Pulse 74  Ht 5\' 2"  (1.575 m)  Wt 103 lb (46.72 kg)  BMI 18.84 kg/m2  GENERAL: 76 year old female; NAD HEENT: NCAT, PERRLA, EOMI; sclera clear; no xanthelasma NECK: palpable bilateral carotid pulses, no bruits; no JVD; no TM LUNGS: Diminished breath sounds at; no crackles or wheezes  CARDIAC: RRR (S1, S2); no significant murmurs; no rubs or gallops ABDOMEN: soft, non-tender; intact BS EXTREMETIES: no significant peripheral edema SKIN: warm/dry; no obvious rash/lesions MUSCULOSKELETAL: no joint deformity NEURO: no focal deficit; NL affect   EKG: reviewed and available in Electronic  Records    ASSESSMENT & PLAN:

## 2010-11-18 NOTE — Assessment & Plan Note (Signed)
Well controlled 

## 2010-11-18 NOTE — Assessment & Plan Note (Signed)
Discussed at length the importance of smoking cessation, for greater than 3 minutes duration. Provided [phone] phone number, for her to call for additional suggestions. She is currently not interested in trying nicotine patches or even Chantix, but is willing to try stopping on her own.

## 2010-11-18 NOTE — Assessment & Plan Note (Signed)
Maintaining NSR on current medication regimen, which includes aspirin. No current indication to treat with Coumadin.

## 2010-11-18 NOTE — Assessment & Plan Note (Signed)
Encouraged regular walking program, and stressed the importance of smoking cessation as part of treatment regimen.

## 2010-11-18 NOTE — Assessment & Plan Note (Signed)
Quiescent on current medication regimen. Renew prescription for NTG.

## 2011-05-10 ENCOUNTER — Other Ambulatory Visit: Payer: Self-pay | Admitting: Cardiology

## 2011-05-20 ENCOUNTER — Encounter: Payer: Self-pay | Admitting: Cardiology

## 2011-05-20 ENCOUNTER — Ambulatory Visit (INDEPENDENT_AMBULATORY_CARE_PROVIDER_SITE_OTHER): Payer: Medicare Other | Admitting: Cardiology

## 2011-05-20 VITALS — BP 127/74 | HR 59 | Ht 62.0 in | Wt 105.0 lb

## 2011-05-20 DIAGNOSIS — F17200 Nicotine dependence, unspecified, uncomplicated: Secondary | ICD-10-CM

## 2011-05-20 DIAGNOSIS — I10 Essential (primary) hypertension: Secondary | ICD-10-CM

## 2011-05-20 DIAGNOSIS — Z8679 Personal history of other diseases of the circulatory system: Secondary | ICD-10-CM

## 2011-05-20 DIAGNOSIS — I7389 Other specified peripheral vascular diseases: Secondary | ICD-10-CM

## 2011-05-20 DIAGNOSIS — I2510 Atherosclerotic heart disease of native coronary artery without angina pectoris: Secondary | ICD-10-CM

## 2011-05-20 DIAGNOSIS — I4891 Unspecified atrial fibrillation: Secondary | ICD-10-CM

## 2011-05-20 NOTE — Patient Instructions (Signed)
Continue all current medications. Your physician wants you to follow up in: 6 months.  You will receive a reminder letter in the mail one-two months in advance.  If you don't receive a letter, please call our office to schedule the follow up appointment   

## 2011-05-22 ENCOUNTER — Encounter: Payer: Self-pay | Admitting: Cardiology

## 2011-05-22 NOTE — Assessment & Plan Note (Signed)
The patient reports no recurrent palpitations.  She remains in normal sinus rhythm by EKG today.  Continue medical therapy.

## 2011-05-22 NOTE — Assessment & Plan Note (Signed)
Blood pressure is well controlled and we will make no changes in her medical regimen.

## 2011-05-22 NOTE — Assessment & Plan Note (Signed)
Patient's last catheterization was in September of 2010.  She has known multivessel coronary artery disease and medical therapy was recommended.  However she does report increased shortness of breath on minimal exertion.  She also has COPD however.  I recommended the patient to proceed to the cardiac catheterization but she currently declined.  She states that if her symptoms worsen she will get back to me.

## 2011-05-22 NOTE — Progress Notes (Signed)
Jacqueline Bottoms, MD, [facility] Board Certified in Adult Cardiovascular Medicine,Internal Medicine and Critical Care Medicine    CC: Followup patient with a history of coronary artery disease and significant peripheral vascular disease.  HPI:  The patient reports no chest pain but does state that she become short of breath on minimal exertion even just walking from her kitchen to the bathroom makes her short of breath.  She also develops claudication over a minimum amount of distance.  She feels that her dyspnea is related to her COPD.  She continues to smoke.  At time she reports that her toes turned blue and she also reports claudication.  However, she does not report any chest pain.  She also has no orthopnea PND, presyncope or syncope.  She denies palpitations.  She continues to smoke half a pack of cigarettes a day.  Blood work and lipid panel is followed by her primary care physician      PMH: reviewed and listed in Problem List in Electronic Records (and see below) Past Medical History  Diagnosis Date  . Chest pain, unspecified   . Chronic airway obstruction, not elsewhere classified   . Unspecified essential hypertension   . Paroxysmal atrial fibrillation   . Unspecified protein-calorie malnutrition   . CAD (coronary artery disease)   . PVD (peripheral vascular disease)     SEVERE  . Tobacco abuse   . Unspecified vascular insufficiency of intestine   . Precordial pain   . Intermediate coronary syndrome    Past Surgical History  Procedure Date  . Abdominal hysterectomy   . Cyst removal from ovary   . Appendectomy     Allergies/SH/FHX : available in Electronic Records for review  No Known Allergies History   Social History  . Marital Status: Widowed    Spouse Name: N/A    Number of Children: N/A  . Years of Education: N/A   Occupational History  . RETIRED    Social History Main Topics  . Smoking status: Current Everyday Smoker -- 1.0 packs/day for 56 years   Types: Cigarettes  . Smokeless tobacco: Never Used  . Alcohol Use: No  . Drug Use: No  . Sexually Active: Not on file   Other Topics Concern  . Not on file   Social History Narrative  . No narrative on file   No family history on file.  Medications: Current Outpatient Prescriptions  Medication Sig Dispense Refill  . ALPRAZolam (XANAX) 0.5 MG tablet Take 1 tablet by mouth as needed.      Marland Kitchen aspirin 81 MG tablet Take 2 tabs (162mg ) daily      . isosorbide mononitrate (IMDUR) 60 MG 24 hr tablet Take 1 tablet (60 mg total) by mouth daily.  30 tablet  6  . metoprolol tartrate (LOPRESSOR) 25 MG tablet Take 1 tablet (25 mg total) by mouth 2 (two) times daily.  60 tablet  6  . nitroGLYCERIN (NITROSTAT) 0.4 MG SL tablet Place 1 tablet (0.4 mg total) under the tongue every 5 (five) minutes as needed.  25 tablet  3  . omeprazole (PRILOSEC) 40 MG capsule Take 40 mg by mouth daily.       . simvastatin (ZOCOR) 40 MG tablet take 1 tablet by mouth at bedtime  30 tablet  6    ROS: No nausea or vomiting. No fever or chills.No melena or hematochezia.No bleeding.Positive for claudication on minimal exertion.  Physical Exam: BP 127/74  Pulse 59  Ht 5\' 2"  (  1.575 m)  Wt 105 lb (47.628 kg)  BMI 19.20 kg/m2 General:Well-nourished white female in no distress Neck:Normal carotid upstroke and no carotid bruits.  No thyromegaly no nodular thyroid.  JVP is 6 cm Lungs:Decreased breath sounds bilaterally but without wheezing Cardiac:Regular rate and rhythm with normal S1 and S2 and no murmur rubs or gallops Vascular:No edema.  Unable to palpate dorsalis pedis and posterior tibial pulses bilaterally Skin:Warm and dry Physcologic:Normal affect  12lead ZOX:WRUEAV sinus rhythm with no acute ischemic changes. Limited bedside ECHO:N/A No images are attached to the encounter.   Assessment and Plan  ATRIAL FIBRILLATION, PAROXYSMAL, HX OF The patient reports no recurrent palpitations.  She remains in normal  sinus rhythm by EKG today.  Continue medical therapy.  CAD Patient's last catheterization was in September of 2010.  She has known multivessel coronary artery disease and medical therapy was recommended.  However she does report increased shortness of breath on minimal exertion.  She also has COPD however.  I recommended the patient to proceed to the cardiac catheterization but she currently declined.  She states that if her symptoms worsen she will get back to me.  PVD WITH CLAUDICATION Known peripheral vascular disease with ABIs 0.65 on the right and 0.78 on the left.  The patient does report claudication.  She has known severe aortic iliac disease but has been treated medically.  If at some point in the future we do proceed with cardiac catheterization the patient will need a distal angiogram.  Tobacco use disorder The patient continues to smoke and I counseled her regarding discontinuation of tobacco.  ESSENTIAL HYPERTENSION, BENIGN Blood pressure is well controlled and we will make no changes in her medical regimen.    Patient Active Problem List  Diagnoses  . DYSLIPIDEMIA  . Tobacco use disorder  . ESSENTIAL HYPERTENSION, BENIGN  . CAD  . PVD WITH CLAUDICATION  . COPD  . VASCULAR INSUFFICIENCY, INTESTINE  . SHORTNESS OF BREATH  . HEMOPTYSIS  . PRECORDIAL PAIN  . DYSPHAGIA ORAL PHASE  . ATRIAL FIBRILLATION, PAROXYSMAL, HX OF

## 2011-05-22 NOTE — Assessment & Plan Note (Signed)
The patient continues to smoke and I counseled her regarding discontinuation of tobacco.

## 2011-05-22 NOTE — Assessment & Plan Note (Signed)
Known peripheral vascular disease with ABIs 0.65 on the right and 0.78 on the left.  The patient does report claudication.  She has known severe aortic iliac disease but has been treated medically.  If at some point in the future we do proceed with cardiac catheterization the patient will need a distal angiogram.

## 2011-08-01 IMAGING — CR DG CHEST 1V PORT
1 series · 1 of 1 positions shown · non-contrast
Comparison: Portable exam 6666 hours without priors for comparison.

CLINICAL DATA: Chest pain, smoker, hypertension

PORTABLE CHEST - 1 VIEW

[view not recorded]
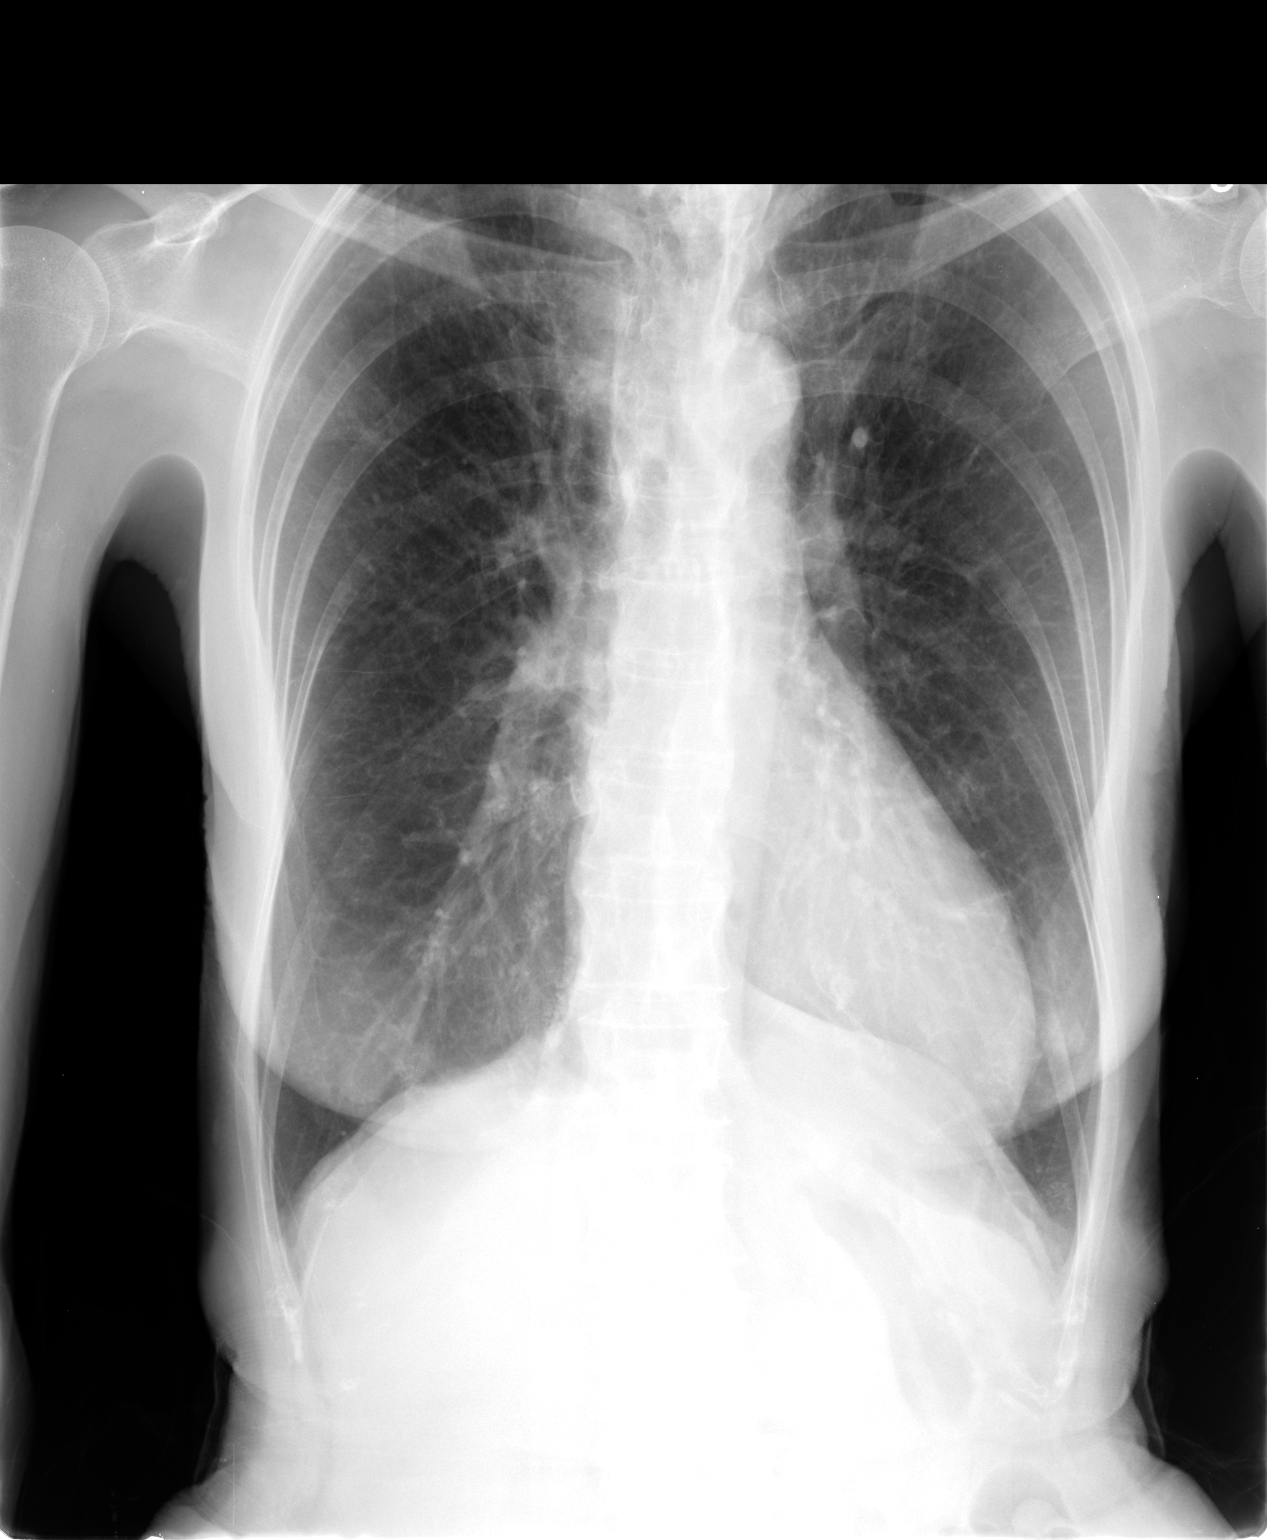

[1 of 1 positions shown; findings below may reference images not displayed]

FINDINGS: Upper normal heart size.
Normal mediastinal contours and pulmonary vascularity.
Emphysematous changes with biapical scarring compatible with COPD.
Bilateral nipple shadows.
No pulmonary infiltrate, pleural effusion or pneumothorax.
Bones appear diffusely demineralized.
IMPRESSION: COPD.
No acute abnormalities.

## 2011-08-06 ENCOUNTER — Other Ambulatory Visit: Payer: Self-pay | Admitting: Physician Assistant

## 2011-12-06 ENCOUNTER — Other Ambulatory Visit: Payer: Self-pay | Admitting: Cardiology

## 2011-12-06 ENCOUNTER — Other Ambulatory Visit: Payer: Self-pay | Admitting: Physician Assistant

## 2012-01-21 ENCOUNTER — Ambulatory Visit (INDEPENDENT_AMBULATORY_CARE_PROVIDER_SITE_OTHER): Payer: Medicare Other | Admitting: Cardiology

## 2012-01-21 ENCOUNTER — Encounter: Payer: Self-pay | Admitting: Cardiology

## 2012-01-21 VITALS — BP 158/80 | HR 72 | Ht 63.0 in | Wt 103.0 lb

## 2012-01-21 DIAGNOSIS — I2510 Atherosclerotic heart disease of native coronary artery without angina pectoris: Secondary | ICD-10-CM

## 2012-01-21 DIAGNOSIS — E785 Hyperlipidemia, unspecified: Secondary | ICD-10-CM

## 2012-01-21 DIAGNOSIS — I480 Paroxysmal atrial fibrillation: Secondary | ICD-10-CM

## 2012-01-21 DIAGNOSIS — R0989 Other specified symptoms and signs involving the circulatory and respiratory systems: Secondary | ICD-10-CM

## 2012-01-21 DIAGNOSIS — F17200 Nicotine dependence, unspecified, uncomplicated: Secondary | ICD-10-CM

## 2012-01-21 DIAGNOSIS — I10 Essential (primary) hypertension: Secondary | ICD-10-CM

## 2012-01-21 DIAGNOSIS — Z8679 Personal history of other diseases of the circulatory system: Secondary | ICD-10-CM

## 2012-01-21 DIAGNOSIS — I4891 Unspecified atrial fibrillation: Secondary | ICD-10-CM

## 2012-01-21 NOTE — Progress Notes (Signed)
HPI The patient presents for followup of her known severe peripheral vascular disease. She has also had paroxysmal atrial fibrillation. She has been managed medically. She unfortunately continues to smoke cigarettes though she would like to cut back. She rarely gets chest discomfort and says she never has to take a nitroglycerin. She denies any ongoing chest pressure, neck or arm discomfort. She does have some baseline dyspnea but she does not describe PND or orthopnea. She is frail but she gets around and actually lives independently. She denies any weight gain or edema. She has had no fevers or chills though she has had some cough.  No Known Allergies  Current Outpatient Prescriptions  Medication Sig Dispense Refill  . albuterol (PROVENTIL) (2.5 MG/3ML) 0.083% nebulizer solution Take 2.5 mg by nebulization every 6 (six) hours as needed.       . ALPRAZolam (XANAX) 0.5 MG tablet Take 1 tablet by mouth as needed.      Marland Kitchen aspirin 81 MG tablet Take 2 tabs (162mg ) daily      . isosorbide mononitrate (IMDUR) 60 MG 24 hr tablet take 1 tablet by mouth once daily  30 tablet  3  . metoprolol tartrate (LOPRESSOR) 25 MG tablet take 1 tablet by mouth twice a day  60 tablet  6  . nitroGLYCERIN (NITROSTAT) 0.4 MG SL tablet Place 1 tablet (0.4 mg total) under the tongue every 5 (five) minutes as needed.  25 tablet  3  . simvastatin (ZOCOR) 40 MG tablet take 1 tablet by mouth at bedtime  30 tablet  2  . tiZANidine (ZANAFLEX) 2 MG tablet Take 2 mg by mouth at bedtime as needed.         Past Medical History  Diagnosis Date  . Chest pain, unspecified   . Chronic airway obstruction, not elsewhere classified   . Unspecified essential hypertension   . Paroxysmal atrial fibrillation   . Unspecified protein-calorie malnutrition   . CAD (coronary artery disease)     LAD 40% stenosis, first and second diagonal 70% ostial stenosis. Circumflex occluded in the midportion. Right coronary artery 80% proximal and  diffuse 90% mid stenosis. EF 65-70%. 2010.  Marland Kitchen PVD (peripheral vascular disease)     SEVERE  . Tobacco abuse   . Unspecified vascular insufficiency of intestine   . Precordial pain   . Intermediate coronary syndrome     Past Surgical History  Procedure Date  . Abdominal hysterectomy   . Cyst removal from ovary   . Appendectomy     ROS:  As stated in the HPI and negative for all other systems.  PHYSICAL EXAM BP 158/80  Pulse 72  Ht 5\' 3"  (1.6 m)  Wt 103 lb (46.72 kg)  BMI 18.25 kg/m2 GENERAL:  Frail appearing HEENT:  Pupils equal round and reactive, fundi not visualized, oral mucosa unremarkable NECK:  No jugular venous distention, waveform within normal limits, carotid upstroke brisk and symmetric, no bruits, no thyromegaly LYMPHATICS:  No cervical, inguinal adenopathy LUNGS:  Clear to auscultation bilaterally BACK:  No CVA tenderness, lordosis CHEST:  Unremarkable HEART:  PMI not displaced or sustained,S1 and S2 within normal limits, no S3, no S4, no clicks, no rubs, no murmurs ABD:  Flat, positive bowel sounds normal in frequency in pitch, no bruits, no rebound, no guarding, no midline pulsatile mass, no hepatomegaly, no splenomegaly EXT:  2 plus pulses upper, diminished dorsalis and pedis posterior tibialis, left femoral bruit, no edema, no cyanosis no clubbing SKIN:  No rashes  no nodules NEURO:  Cranial nerves II through XII grossly intact, motor grossly intact throughout PSYCH:  Cognitively intact, oriented to person place and time   EKG:  Sinus rhythm, rate 72, left axis deviation, low-voltage in the limb leads, poor anterior R wave progression, no acute ST-T wave changes. 01/21/2012  ASSESSMENT AND PLAN  ATRIAL FIBRILLATION, PAROXYSMAL, HX OF  She has had no documented symptomatic paroxysms. No change in therapy is indicated.  CAD  I reviewed her catheterization from 2010. She required medical management then due to the diffuse nature of her disease. She has no  unstable symptoms. No change in therapy is indicated.  PVD WITH CLAUDICATION  Known peripheral vascular disease with ABIs 0.65 on the right and 0.78 on the left. The patient does report claudication. She has known severe aortic iliac disease but has been treated medically. Given the stable nature of her complaints however we will continue with risk reduction.  Tobacco use disorder  She's starting to use the electronic cigarettes which I cannot endorse according to the FDA. However, I am excited that she is thinking about quitting smoking.  ESSENTIAL HYPERTENSION, BENIGN  Although her blood pressure is slightly elevated today he was otherwise well controlled at previous appointments. No further workup is necessary.  Dyslipidemia New guidelines might suggest switching to a maximum dose of medications such as Lipitor 80 mg. However, she's a tiny patient with a previous LDL of 68. I think continuing with the Zocor that she is tolerating is reasonable.

## 2012-01-21 NOTE — Patient Instructions (Addendum)

## 2012-03-09 ENCOUNTER — Other Ambulatory Visit: Payer: Self-pay | Admitting: Physician Assistant

## 2012-04-06 ENCOUNTER — Other Ambulatory Visit: Payer: Self-pay | Admitting: Physician Assistant

## 2012-05-10 ENCOUNTER — Encounter (INDEPENDENT_AMBULATORY_CARE_PROVIDER_SITE_OTHER): Payer: Medicare Other | Admitting: Ophthalmology

## 2012-05-10 DIAGNOSIS — H3530 Unspecified macular degeneration: Secondary | ICD-10-CM

## 2012-05-10 DIAGNOSIS — H26499 Other secondary cataract, unspecified eye: Secondary | ICD-10-CM

## 2012-05-10 DIAGNOSIS — H43819 Vitreous degeneration, unspecified eye: Secondary | ICD-10-CM

## 2012-05-11 ENCOUNTER — Encounter (INDEPENDENT_AMBULATORY_CARE_PROVIDER_SITE_OTHER): Payer: Self-pay | Admitting: Ophthalmology

## 2012-05-19 ENCOUNTER — Ambulatory Visit (INDEPENDENT_AMBULATORY_CARE_PROVIDER_SITE_OTHER): Payer: Medicare Other | Admitting: Ophthalmology

## 2012-05-19 DIAGNOSIS — H26499 Other secondary cataract, unspecified eye: Secondary | ICD-10-CM

## 2012-06-01 ENCOUNTER — Encounter (INDEPENDENT_AMBULATORY_CARE_PROVIDER_SITE_OTHER): Payer: Medicare Other | Admitting: Ophthalmology

## 2012-06-01 DIAGNOSIS — H2700 Aphakia, unspecified eye: Secondary | ICD-10-CM

## 2012-06-01 DIAGNOSIS — H3530 Unspecified macular degeneration: Secondary | ICD-10-CM

## 2012-08-07 ENCOUNTER — Other Ambulatory Visit: Payer: Self-pay | Admitting: Cardiology

## 2012-08-07 MED ORDER — ISOSORBIDE MONONITRATE ER 60 MG PO TB24: 60.0000 mg | ORAL_TABLET | Freq: Every day | ORAL | Status: DC

## 2012-08-31 ENCOUNTER — Encounter: Payer: Self-pay | Admitting: Cardiology

## 2012-08-31 ENCOUNTER — Ambulatory Visit (INDEPENDENT_AMBULATORY_CARE_PROVIDER_SITE_OTHER): Payer: Medicare Other | Admitting: Cardiology

## 2012-08-31 VITALS — BP 136/63 | HR 71 | Ht 62.0 in | Wt 97.0 lb

## 2012-08-31 DIAGNOSIS — R0602 Shortness of breath: Secondary | ICD-10-CM

## 2012-08-31 DIAGNOSIS — Z8679 Personal history of other diseases of the circulatory system: Secondary | ICD-10-CM

## 2012-08-31 DIAGNOSIS — Z79899 Other long term (current) drug therapy: Secondary | ICD-10-CM

## 2012-08-31 DIAGNOSIS — I2510 Atherosclerotic heart disease of native coronary artery without angina pectoris: Secondary | ICD-10-CM

## 2012-08-31 NOTE — Patient Instructions (Addendum)
Your physician recommends that you schedule a follow-up appointment in: 6 months. You will receive a reminder letter in the mail in about 4 months reminding you to call and schedule your appointment. If you don't receive this letter, please contact our office. Your physician recommends that you continue on your current medications as directed. Please refer to the Current Medication list given to you today. Patient going to ED for evaluation.

## 2012-08-31 NOTE — Progress Notes (Signed)
HPI The patient presents for followup of multiple medical problems. These are listed below. On Friday of last week she ate some coconut take and developed nausea and vomiting she subsequently had diarrhea with some bleeding. She has been weak since then. Her family has been trying to get her to go to the emergency room. She did report some questionable fevers on Friday and Saturday. She says she's getting better with no further bleeding. She is weak. Her breathing is short but this is at baseline. She has a chronic cough but this seems to be unchanged. She's not describing new PND or orthopnea. She's having no new chest pressure, neck or arm discomfort.  No Known Allergies  Current Outpatient Prescriptions  Medication Sig Dispense Refill  . albuterol (PROVENTIL) (2.5 MG/3ML) 0.083% nebulizer solution Take 2.5 mg by nebulization every 6 (six) hours as needed.       . ALPRAZolam (XANAX) 0.5 MG tablet Take 1 tablet by mouth as needed.      Marland Kitchen aspirin 81 MG tablet Take 2 tabs (162mg ) daily      . isosorbide mononitrate (IMDUR) 60 MG 24 hr tablet Take 1 tablet (60 mg total) by mouth daily.  30 tablet  3  . metoprolol tartrate (LOPRESSOR) 25 MG tablet take 1 tablet by mouth twice a day  60 tablet  6  . nitroGLYCERIN (NITROSTAT) 0.4 MG SL tablet Place 1 tablet (0.4 mg total) under the tongue every 5 (five) minutes as needed.  25 tablet  3  . simvastatin (ZOCOR) 40 MG tablet take 1 tablet by mouth at bedtime  30 tablet  6   No current facility-administered medications for this visit.    Past Medical History  Diagnosis Date  . Chest pain, unspecified   . Chronic airway obstruction, not elsewhere classified   . Unspecified essential hypertension   . Paroxysmal atrial fibrillation   . Unspecified protein-calorie malnutrition   . CAD (coronary artery disease)     LAD 40% stenosis, first and second diagonal 70% ostial stenosis. Circumflex occluded in the midportion. Right coronary artery 80% proximal  and diffuse 90% mid stenosis. EF 65-70%. 2010.  Marland Kitchen PVD (peripheral vascular disease)     SEVERE  . Tobacco abuse   . Unspecified vascular insufficiency of intestine   . Precordial pain   . Intermediate coronary syndrome     Past Surgical History  Procedure Laterality Date  . Abdominal hysterectomy    . Cyst removal from ovary    . Appendectomy      ROS:  As stated in the HPI and negative for all other systems.  PHYSICAL EXAM BP 136/63  Pulse 71  Ht 5\' 2"  (1.575 m)  Wt 97 lb (43.999 kg)  BMI 17.74 kg/m2  SpO2 93% GENERAL:  Frail appearing HEENT:  Pupils equal round and reactive, fundi not visualized, oral mucosa unremarkable NECK:  No jugular venous distention, waveform within normal limits, carotid upstroke brisk and symmetric, no bruits, no thyromegaly LYMPHATICS:  No cervical, inguinal adenopathy LUNGS:  Clear to auscultation bilaterally BACK:  No CVA tenderness, lordosis CHEST:  Unremarkable HEART:  PMI not displaced or sustained,S1 and S2 within normal limits, no S3, no S4, no clicks, no rubs, no murmurs ABD:  Flat, positive bowel sounds normal in frequency in pitch, no bruits, no rebound, no guarding, no midline pulsatile mass, no hepatomegaly, no splenomegaly EXT:  2 plus pulses upper, diminished dorsalis and pedis posterior tibialis, left femoral bruit, no edema, no cyanosis no  clubbing SKIN:  No rashes no nodules NEURO:  Cranial nerves II through XII grossly intact, motor grossly intact throughout PSYCH:  Cognitively intact, oriented to person place and time  ASSESSMENT AND PLAN  ATRIAL FIBRILLATION, PAROXYSMAL, HX OF  She has had no documented symptomatic paroxysms. No change in therapy is indicated. Given her very frail state I think she would be high risk for anticoagulation.  CAD  I previously reviewed her catheterization from 2010. She required medical management then due to the diffuse nature of her disease. She has no unstable symptoms. No change in therapy  is indicated.  PVD WITH CLAUDICATION  Known peripheral vascular disease with ABIs 0.65 on the right and 0.78 on the left. The patient does report claudication. She has known severe aortic iliac disease but has been treated medically. Given the stable nature of her complaints however we will continue with risk reduction.  Tobacco use disorder  She's starting to use the electronic cigarettes which I cannot endorse according to the FDA.   ESSENTIAL HYPERTENSION, BENIGN  The blood pressure is at target. No change in medications is indicated. We will continue with therapeutic lifestyle changes (TLC).  N/V Given this and the report of GI bleeding I will send her for labs. Her family wants her in the emergency room but she refuses to go.

## 2012-10-09 ENCOUNTER — Other Ambulatory Visit: Payer: Self-pay | Admitting: Physician Assistant

## 2012-12-07 ENCOUNTER — Other Ambulatory Visit: Payer: Self-pay | Admitting: *Deleted

## 2012-12-07 MED ORDER — ISOSORBIDE MONONITRATE ER 60 MG PO TB24: 60.0000 mg | ORAL_TABLET | Freq: Every day | ORAL | Status: DC

## 2013-04-03 ENCOUNTER — Ambulatory Visit (INDEPENDENT_AMBULATORY_CARE_PROVIDER_SITE_OTHER): Payer: Medicare Other | Admitting: Cardiology

## 2013-04-03 ENCOUNTER — Encounter: Payer: Self-pay | Admitting: Cardiology

## 2013-04-03 VITALS — BP 167/75 | HR 74 | Ht 62.5 in | Wt 97.4 lb

## 2013-04-03 DIAGNOSIS — I2510 Atherosclerotic heart disease of native coronary artery without angina pectoris: Secondary | ICD-10-CM

## 2013-04-03 DIAGNOSIS — I10 Essential (primary) hypertension: Secondary | ICD-10-CM

## 2013-04-03 MED ORDER — METOPROLOL TARTRATE 25 MG PO TABS: 25.0000 mg | ORAL_TABLET | Freq: Two times a day (BID) | ORAL | Status: DC

## 2013-04-03 NOTE — Progress Notes (Signed)
HPI The patient presents for followup of significant vascular disease.  Since I last saw her she has actually done relatively well. She doesn't report any acute hospitalizations. She is frail and gets around with a cane. However, she has had no acute symptoms. The patient denies any new symptoms such as chest discomfort, neck or arm discomfort. There has been no new shortness of breath, PND or orthopnea. There have been no reported palpitations, presyncope or syncope.  She does continue to smoke a few cigarettes but has cut down on this. She does get leg pain but this is stable.   No Known Allergies  Current Outpatient Prescriptions  Medication Sig Dispense Refill  . albuterol (PROVENTIL) (2.5 MG/3ML) 0.083% nebulizer solution Take 2.5 mg by nebulization every 6 (six) hours as needed.       Marland Kitchen. aspirin 81 MG tablet Take 2 tabs (162mg ) daily      . isosorbide mononitrate (IMDUR) 60 MG 24 hr tablet Take 1 tablet (60 mg total) by mouth daily.  30 tablet  6  . Krill Oil 300 MG CAPS Take 300 mg by mouth daily.      . metoprolol tartrate (LOPRESSOR) 25 MG tablet take 1 tablet by mouth twice a day  60 tablet  6  . nitroGLYCERIN (NITROSTAT) 0.4 MG SL tablet Place 1 tablet (0.4 mg total) under the tongue every 5 (five) minutes as needed.  25 tablet  3  . simvastatin (ZOCOR) 40 MG tablet take 1 tablet by mouth once daily  30 tablet  6   No current facility-administered medications for this visit.    Past Medical History  Diagnosis Date  . Chest pain, unspecified   . Chronic airway obstruction, not elsewhere classified   . Unspecified essential hypertension   . Paroxysmal atrial fibrillation   . Unspecified protein-calorie malnutrition   . CAD (coronary artery disease)     LAD 40% stenosis, first and second diagonal 70% ostial stenosis. Circumflex occluded in the midportion. Right coronary artery 80% proximal and diffuse 90% mid stenosis. EF 65-70%. 2010.  Marland Kitchen. PVD (peripheral vascular disease)    SEVERE  . Tobacco abuse   . Unspecified vascular insufficiency of intestine   . Precordial pain   . Intermediate coronary syndrome     Past Surgical History  Procedure Laterality Date  . Abdominal hysterectomy    . Cyst removal from ovary    . Appendectomy      ROS:  As stated in the HPI and negative for all other systems.  PHYSICAL EXAM BP 167/75  Pulse 74  Ht 5' 2.5" (1.588 m)  Wt 97 lb 6.4 oz (44.18 kg)  BMI 17.52 kg/m2 GENERAL:  Frail appearing HEENT:  Pupils equal round and reactive, fundi not visualized, oral mucosa unremarkable NECK:  No jugular venous distention, waveform within normal limits, carotid upstroke brisk and symmetric, no bruits, no thyromegaly LYMPHATICS:  No cervical, inguinal adenopathy LUNGS:  Clear to auscultation bilaterally BACK:  No CVA tenderness, lordosis CHEST:  Unremarkable HEART:  PMI not displaced or sustained,S1 and S2 within normal limits, no S3, no S4, no clicks, no rubs, no murmurs ABD:  Flat, positive bowel sounds normal in frequency in pitch, no bruits, no rebound, no guarding, no midline pulsatile mass, no hepatomegaly, no splenomegaly EXT:  2 plus pulses upper, diminished dorsalis and pedis posterior tibialis, left femoral bruit, no edema, no cyanosis no clubbing SKIN:  No rashes no nodules NEURO:  Cranial nerves II through XII grossly intact,  motor grossly intact throughout PSYCH:  Cognitively intact, oriented to person place and time  EKG:  Sinus rhythm with multifocal atrial ectopic beats, low-voltage in limb leads, left axis deviation, nonspecific diffuse T wave changes.  04/03/2013  ASSESSMENT AND PLAN  ATRIAL FIBRILLATION, PAROXYSMAL, HX OF  She has had no documented symptomatic paroxysms. No change in therapy is indicated. Given her very frail state I think she would be high risk for anticoagulation.  CAD  I previously reviewed her catheterization from 2010. She required medical management then due to the diffuse nature of  her disease. She has no unstable symptoms. No change in therapy is indicated.  PVD WITH CLAUDICATION  Known peripheral vascular disease with ABIs 0.65 on the right and 0.78 on the left. The patient does report claudication. She has known severe aortic iliac disease but has been treated medically. Given the stable nature of her complaints however we will continue with risk reduction again this visit.  She understands that a significant therapy is stopping smoking.  Tobacco use disorder  She's cutting down.  She knows that she needs to quit.  ESSENTIAL HYPERTENSION, BENIGN  Her BP is elevated.  However, she did not take her medications today. She's going to keep a blood pressure diary and let me know if it is trending high

## 2013-04-03 NOTE — Patient Instructions (Signed)

## 2013-06-04 ENCOUNTER — Ambulatory Visit (INDEPENDENT_AMBULATORY_CARE_PROVIDER_SITE_OTHER): Payer: Medicare Other | Admitting: Ophthalmology

## 2013-07-05 ENCOUNTER — Other Ambulatory Visit: Payer: Self-pay | Admitting: *Deleted

## 2013-07-05 MED ORDER — ISOSORBIDE MONONITRATE ER 60 MG PO TB24: 60.0000 mg | ORAL_TABLET | Freq: Every day | ORAL | Status: DC

## 2013-08-21 ENCOUNTER — Telehealth: Payer: Self-pay | Admitting: Cardiology

## 2013-08-21 NOTE — Telephone Encounter (Signed)
Patient's right leg around her ankle has ulcer that has come up.  Stated that Dr Antoine PocheHochrein always looks at her feet to make sure she doesn't have any sores

## 2013-08-21 NOTE — Telephone Encounter (Signed)
Called and spoke with patient. Patient said she has a sore that has come up on her ankle. Nurse advised patient that she needed to contact her PCP and if he felt she needed to see a vascular doctor to let us know because we have vascular doctors in our group. Patient verbalized understanding of plan.

## 2014-02-04 ENCOUNTER — Telehealth: Payer: Self-pay | Admitting: *Deleted

## 2014-02-04 MED ORDER — ISOSORBIDE MONONITRATE ER 60 MG PO TB24: 60.0000 mg | ORAL_TABLET | Freq: Every day | ORAL | Status: DC

## 2014-02-04 NOTE — Telephone Encounter (Signed)
Refill request isosorbide 60 mg from [facility] [place]. Medication sent to pharmacy.

## 2014-04-03 ENCOUNTER — Ambulatory Visit (INDEPENDENT_AMBULATORY_CARE_PROVIDER_SITE_OTHER): Payer: Medicare Other | Admitting: Cardiology

## 2014-04-03 ENCOUNTER — Encounter: Payer: Self-pay | Admitting: Cardiology

## 2014-04-03 VITALS — BP 118/80 | HR 62 | Ht 63.0 in | Wt 100.0 lb

## 2014-04-03 DIAGNOSIS — I10 Essential (primary) hypertension: Secondary | ICD-10-CM

## 2014-04-03 NOTE — Progress Notes (Signed)
HPI The patient presents for followup of significant vascular disease.  Since I last saw her she has had no acute complaints.  She doesn't report any acute hospitalizations. She is frail and gets around with a cane.  She uses O2 PRN.  She continues to smoke.   However, she has had no acute symptoms. There has been no PND or orthopnea. There have been no reported palpitations, presyncope or syncope.  She does get leg pain but this is stable and has had a small ulcer that is managed by Dr. Olena LeatherwoodHasanaj.  She does have some chronic chest pain episodically. She says this happens every 3-6 months and it is stable pattern. She'll get a hurting "around my heart" that lasted constantly for a couple of days and then go away when she starts moving gas or burping. She is chronically dyspneic but has a diagnosis of COPD and emphysema and this is unchanged.   No Known Allergies  Current Outpatient Prescriptions  Medication Sig Dispense Refill  . ALPRAZolam (XANAX) 0.5 MG tablet Take 0.5 mg by mouth daily.   0  . aspirin 81 MG tablet Take 2 tabs (162mg ) daily    . Calcium Carb-Cholecalciferol 600-800 MG-UNIT TABS Take 600-800 mg by mouth 2 (two) times daily.    . isosorbide mononitrate (IMDUR) 60 MG 24 hr tablet Take 1 tablet (60 mg total) by mouth daily. 30 tablet 6  . Krill Oil 300 MG CAPS Take 300 mg by mouth daily.    . metoprolol tartrate (LOPRESSOR) 25 MG tablet Take 1 tablet (25 mg total) by mouth 2 (two) times daily. 180 tablet 3  . nitroGLYCERIN (NITROSTAT) 0.4 MG SL tablet Place 1 tablet (0.4 mg total) under the tongue every 5 (five) minutes as needed. 25 tablet 3  . simvastatin (ZOCOR) 40 MG tablet take 1 tablet by mouth once daily 30 tablet 6  . triamcinolone cream (KENALOG) 0.1 % Apply 0.1 application topically daily.    Marland Kitchen. albuterol (PROVENTIL) (2.5 MG/3ML) 0.083% nebulizer solution Take 2.5 mg by nebulization every 6 (six) hours as needed.      No current facility-administered medications for this  visit.    Past Medical History  Diagnosis Date  . Chest pain, unspecified   . Chronic airway obstruction, not elsewhere classified   . Unspecified essential hypertension   . Paroxysmal atrial fibrillation   . Unspecified protein-calorie malnutrition   . CAD (coronary artery disease)     LAD 40% stenosis, first and second diagonal 70% ostial stenosis. Circumflex occluded in the midportion. Right coronary artery 80% proximal and diffuse 90% mid stenosis. EF 65-70%. 2010.  Marland Kitchen. PVD (peripheral vascular disease)     SEVERE  . Tobacco abuse   . Unspecified vascular insufficiency of intestine   . Precordial pain   . Intermediate coronary syndrome     Past Surgical History  Procedure Laterality Date  . Abdominal hysterectomy    . Cyst removal from ovary    . Appendectomy      ROS:  As stated in the HPI and negative for all other systems.  PHYSICAL EXAM BP 118/80 mmHg  Pulse 62  Ht 5\' 3"  (1.6 m)  Wt 100 lb (45.36 kg)  BMI 17.72 kg/m2 GENERAL:  Frail appearing HEENT:  Pupils equal round and reactive, fundi not visualized, oral mucosa unremarkable NECK:  No jugular venous distention, waveform within normal limits, carotid upstroke brisk and symmetric, no bruits, no thyromegaly LYMPHATICS:  No cervical, inguinal adenopathy LUNGS:  Clear  to auscultation bilaterally BACK:  No CVA tenderness, lordosis CHEST:  Unremarkable HEART:  PMI not displaced or sustained,S1 and S2 within normal limits, no S3, no S4, no clicks, no rubs, no murmurs ABD:  Flat, positive bowel sounds normal in frequency in pitch, no bruits, no rebound, no guarding, no midline pulsatile mass, no hepatomegaly, no splenomegaly EXT:  2 plus pulses upper, diminished dorsalis and pedis posterior tibialis, left femoral bruit, no edema, no cyanosis no clubbing SKIN:  No rashes no nodules  EKG:  Sinus rhythm, rate 62, low-voltage in limb leads, left axis deviation, nonspecific diffuse T wave changes, poor anterior R wave  progression, QTC prolonged.  Compared with previous atrial ectopy is no longer present I  04/03/2014  ASSESSMENT AND PLAN  ATRIAL FIBRILLATION, PAROXYSMAL, HX OF  She has had no documented symptomatic paroxysms. No change in therapy is indicated. Given her very frail state I think she would be high risk for anticoagulation.  CAD  I previously reviewed her catheterization from 2010. She required medical management then due to the diffuse nature of her disease. She has no unstable symptoms. No change in therapy is indicated.  She will let me know if she has any increasing pattern of chest pain in the first attempt would be medical management even her comorbid condition and frail state.  PVD WITH CLAUDICATION  Known peripheral vascular disease with ABIs 0.65 on the right and 0.78 on the left. The patient does report claudication. She has known severe aortic iliac disease but has been treated medically. Given the stable nature of her complaints however we will continue with risk reduction again this visit.  She understands that a significant therapy is stopping smoking.  Tobacco use disorder  She is unable to quit.  We have talked about this.   She and her daughter understand that this is a life limiting problem for this patient.  She apparently has advanced lung disease.  I will defer to [facility] A, MD  ESSENTIAL HYPERTENSION, BENIGN  Her BP is OK.   She will remain on the meds as listed.

## 2014-04-03 NOTE — Patient Instructions (Signed)
The current medical regimen is effective;  continue present plan and medications.  Follow up in 1 year with Dr. Hochrein in [place].  You will receive a letter in the mail 2 months before you are due.  Please call us when you receive this letter to schedule your follow up appointment.  Thank you for choosing [facility]!!     

## 2014-05-03 ENCOUNTER — Other Ambulatory Visit: Payer: Self-pay | Admitting: *Deleted

## 2014-05-03 MED ORDER — METOPROLOL TARTRATE 25 MG PO TABS: 25.0000 mg | ORAL_TABLET | Freq: Two times a day (BID) | ORAL | Status: DC

## 2014-09-05 ENCOUNTER — Other Ambulatory Visit: Payer: Self-pay | Admitting: *Deleted

## 2014-09-05 MED ORDER — ISOSORBIDE MONONITRATE ER 60 MG PO TB24: 60.0000 mg | ORAL_TABLET | Freq: Every day | ORAL | Status: DC

## 2015-02-15 DIAGNOSIS — J449 Chronic obstructive pulmonary disease, unspecified: Secondary | ICD-10-CM | POA: Diagnosis not present

## 2015-03-10 DIAGNOSIS — Z1389 Encounter for screening for other disorder: Secondary | ICD-10-CM | POA: Diagnosis not present

## 2015-03-10 DIAGNOSIS — J440 Chronic obstructive pulmonary disease with acute lower respiratory infection: Secondary | ICD-10-CM | POA: Diagnosis not present

## 2015-03-10 DIAGNOSIS — L89151 Pressure ulcer of sacral region, stage 1: Secondary | ICD-10-CM | POA: Diagnosis not present

## 2015-03-10 DIAGNOSIS — Z0000 Encounter for general adult medical examination without abnormal findings: Secondary | ICD-10-CM | POA: Diagnosis not present

## 2015-03-12 ENCOUNTER — Encounter: Payer: Self-pay | Admitting: Cardiology

## 2015-03-12 ENCOUNTER — Ambulatory Visit (INDEPENDENT_AMBULATORY_CARE_PROVIDER_SITE_OTHER): Payer: Medicare Other | Admitting: Cardiology

## 2015-03-12 VITALS — BP 90/55 | HR 66 | Ht 63.0 in | Wt 98.0 lb

## 2015-03-12 DIAGNOSIS — I10 Essential (primary) hypertension: Secondary | ICD-10-CM | POA: Diagnosis not present

## 2015-03-12 MED ORDER — ISOSORBIDE MONONITRATE ER 60 MG PO TB24: 60.0000 mg | ORAL_TABLET | Freq: Every day | ORAL | Status: AC

## 2015-03-12 MED ORDER — METOPROLOL TARTRATE 25 MG PO TABS: 25.0000 mg | ORAL_TABLET | Freq: Two times a day (BID) | ORAL | Status: AC

## 2015-03-12 NOTE — Progress Notes (Signed)
HPI The patient presents for followup of significant vascular disease.  Since I last saw her she has had no acute complaints.  She doesn't report any acute hospitalizations. She is frail and gets around with a cane.  She uses O2 more frequently than before.  She continues to smoke but is cutting down.  There has been no PND or orthopnea. There have been no reported palpitations, presyncope or syncope.  She has some chronic chest pain that has been stable for years and that she believes is gass.  She is chronically dyspneic but has a diagnosis of COPD and emphysema and this is unchanged.   No Known Allergies  Current Outpatient Prescriptions  Medication Sig Dispense Refill  . albuterol (PROVENTIL) (2.5 MG/3ML) 0.083% nebulizer solution Take 2.5 mg by nebulization every 6 (six) hours as needed.     . ALPRAZolam (XANAX) 0.5 MG tablet Take 0.5 mg by mouth daily.   0  . aspirin 81 MG tablet 81 mg. Take two tablets daily    . Calcium Carb-Cholecalciferol 600-800 MG-UNIT TABS Take 600-800 mg by mouth 2 (two) times daily.    . isosorbide mononitrate (IMDUR) 60 MG 24 hr tablet Take 1 tablet (60 mg total) by mouth daily. 30 tablet 5  . metoprolol tartrate (LOPRESSOR) 25 MG tablet Take 1 tablet (25 mg total) by mouth 2 (two) times daily. 180 tablet 3  . nitroGLYCERIN (NITROSTAT) 0.4 MG SL tablet Place 1 tablet (0.4 mg total) under the tongue every 5 (five) minutes as needed. 25 tablet 3  . simvastatin (ZOCOR) 40 MG tablet take 1 tablet by mouth once daily 30 tablet 6  . triamcinolone cream (KENALOG) 0.1 % Apply 0.1 application topically daily.    Boris Lown Oil 300 MG CAPS Take 300 mg by mouth daily. Reported on 03/12/2015     No current facility-administered medications for this visit.    Past Medical History  Diagnosis Date  . Chest pain, unspecified   . Chronic airway obstruction, not elsewhere classified   . Unspecified essential hypertension   . Paroxysmal atrial fibrillation (HCC)   .  Unspecified protein-calorie malnutrition (HCC)   . CAD (coronary artery disease)     LAD 40% stenosis, first and second diagonal 70% ostial stenosis. Circumflex occluded in the midportion. Right coronary artery 80% proximal and diffuse 90% mid stenosis. EF 65-70%. 2010.  Marland Kitchen PVD (peripheral vascular disease) (HCC)     SEVERE  . Tobacco abuse   . Unspecified vascular insufficiency of intestine     Past Surgical History  Procedure Laterality Date  . Abdominal hysterectomy    . Cyst removal from ovary    . Appendectomy      ROS:  As stated in the HPI and negative for all other systems.  PHYSICAL EXAM BP 90/55 mmHg  Pulse 66  Ht  (1.6 m)  Wt 98 lb (44.453 kg)  BMI 17.36 kg/m2 GENERAL:  Frail appearing HEENT:  Pupils equal round and reactive, fundi not visualized, oral mucosa unremarkable NECK:  No jugular venous distention, waveform within normal limits, carotid upstroke brisk and symmetric, no bruits, no thyromegaly LYMPHATICS:  No cervical, inguinal adenopathy LUNGS:  Clear to auscultation bilaterally BACK:  No CVA tenderness, lordosis CHEST:  Unremarkable HEART:  PMI not displaced or sustained,S1 and S2 within normal limits, no S3, no S4, no clicks, no rubs, no murmurs ABD:  Flat, positive bowel sounds normal in frequency in pitch, no bruits, no rebound, no guarding, no midline pulsatile  mass, no hepatomegaly, no splenomegaly EXT:  2 plus pulses upper, diminished dorsalis and pedis posterior tibialis, left femoral bruit, no edema, no cyanosis no clubbing SKIN:  No rashes no nodules  EKG:  Sinus rhythm, rate 70, low-voltage in limb leads, left axis deviation, nonspecific diffuse T wave changes, poor anterior R wave progression, QTC prolonged.  No change from previous  03/12/2015  ASSESSMENT AND PLAN  ATRIAL FIBRILLATION, PAROXYSMAL, HX OF  She has had no documented symptomatic paroxysms. No change in therapy is indicated. Given her very frail state I think she would be high  risk for anticoagulation.  CAD  She has had no angina and will continue the meds as listed.   She is certainly not a candidate for further intervention or studies given her end stage lung disease.   PVD WITH CLAUDICATION  Known peripheral vascular disease with ABIs 0.65 on the right and 0.78 on the left. The patient does not report claudication. She has a small non open lesion on her right ankle and this is likely related to pressure and she is taking mechanical measures to make sure that this does not open.   Tobacco use disorder  She is down to less than a cigarette per day on some days.    ESSENTIAL HYPERTENSION, BENIGN  Her BP is low.   She will remain on the meds as listed however.

## 2015-03-12 NOTE — Patient Instructions (Signed)

## 2015-03-13 ENCOUNTER — Encounter: Payer: Self-pay | Admitting: Cardiology

## 2015-03-18 DIAGNOSIS — J449 Chronic obstructive pulmonary disease, unspecified: Secondary | ICD-10-CM | POA: Diagnosis not present

## 2015-04-15 DIAGNOSIS — J449 Chronic obstructive pulmonary disease, unspecified: Secondary | ICD-10-CM | POA: Diagnosis not present

## 2015-05-05 DIAGNOSIS — L89892 Pressure ulcer of other site, stage 2: Secondary | ICD-10-CM | POA: Diagnosis not present

## 2015-05-15 DIAGNOSIS — H1013 Acute atopic conjunctivitis, bilateral: Secondary | ICD-10-CM | POA: Diagnosis not present

## 2015-05-15 DIAGNOSIS — H40033 Anatomical narrow angle, bilateral: Secondary | ICD-10-CM | POA: Diagnosis not present

## 2015-05-16 DIAGNOSIS — J449 Chronic obstructive pulmonary disease, unspecified: Secondary | ICD-10-CM | POA: Diagnosis not present

## 2015-05-26 DIAGNOSIS — I739 Peripheral vascular disease, unspecified: Secondary | ICD-10-CM | POA: Diagnosis not present

## 2015-05-26 DIAGNOSIS — M79671 Pain in right foot: Secondary | ICD-10-CM | POA: Diagnosis not present

## 2015-05-26 DIAGNOSIS — L89892 Pressure ulcer of other site, stage 2: Secondary | ICD-10-CM | POA: Diagnosis not present

## 2015-06-06 DIAGNOSIS — Z0000 Encounter for general adult medical examination without abnormal findings: Secondary | ICD-10-CM | POA: Diagnosis not present

## 2015-06-06 DIAGNOSIS — I7389 Other specified peripheral vascular diseases: Secondary | ICD-10-CM | POA: Diagnosis not present

## 2015-06-06 DIAGNOSIS — J440 Chronic obstructive pulmonary disease with acute lower respiratory infection: Secondary | ICD-10-CM | POA: Diagnosis not present

## 2015-06-06 DIAGNOSIS — Z681 Body mass index (BMI) 19 or less, adult: Secondary | ICD-10-CM | POA: Diagnosis not present

## 2015-06-06 DIAGNOSIS — L89151 Pressure ulcer of sacral region, stage 1: Secondary | ICD-10-CM | POA: Diagnosis not present

## 2015-06-15 DIAGNOSIS — J449 Chronic obstructive pulmonary disease, unspecified: Secondary | ICD-10-CM | POA: Diagnosis not present

## 2015-06-17 DIAGNOSIS — L89892 Pressure ulcer of other site, stage 2: Secondary | ICD-10-CM | POA: Diagnosis not present

## 2015-06-17 DIAGNOSIS — I739 Peripheral vascular disease, unspecified: Secondary | ICD-10-CM | POA: Diagnosis not present

## 2015-07-16 DIAGNOSIS — J449 Chronic obstructive pulmonary disease, unspecified: Secondary | ICD-10-CM | POA: Diagnosis not present

## 2015-08-12 DIAGNOSIS — I739 Peripheral vascular disease, unspecified: Secondary | ICD-10-CM | POA: Diagnosis not present

## 2015-08-12 DIAGNOSIS — L89892 Pressure ulcer of other site, stage 2: Secondary | ICD-10-CM | POA: Diagnosis not present

## 2015-08-15 DIAGNOSIS — J449 Chronic obstructive pulmonary disease, unspecified: Secondary | ICD-10-CM | POA: Diagnosis not present

## 2015-08-19 DIAGNOSIS — I7389 Other specified peripheral vascular diseases: Secondary | ICD-10-CM | POA: Diagnosis not present

## 2015-08-19 DIAGNOSIS — J441 Chronic obstructive pulmonary disease with (acute) exacerbation: Secondary | ICD-10-CM | POA: Diagnosis not present

## 2015-08-19 DIAGNOSIS — I10 Essential (primary) hypertension: Secondary | ICD-10-CM | POA: Diagnosis not present

## 2015-08-22 DIAGNOSIS — R1084 Generalized abdominal pain: Secondary | ICD-10-CM | POA: Diagnosis not present

## 2015-08-22 DIAGNOSIS — R140 Abdominal distension (gaseous): Secondary | ICD-10-CM | POA: Diagnosis not present

## 2015-09-01 DIAGNOSIS — Z681 Body mass index (BMI) 19 or less, adult: Secondary | ICD-10-CM | POA: Diagnosis not present

## 2015-09-01 DIAGNOSIS — I7389 Other specified peripheral vascular diseases: Secondary | ICD-10-CM | POA: Diagnosis not present

## 2015-09-01 DIAGNOSIS — J440 Chronic obstructive pulmonary disease with acute lower respiratory infection: Secondary | ICD-10-CM | POA: Diagnosis not present

## 2015-09-01 DIAGNOSIS — Z79899 Other long term (current) drug therapy: Secondary | ICD-10-CM | POA: Diagnosis not present

## 2015-09-01 DIAGNOSIS — N952 Postmenopausal atrophic vaginitis: Secondary | ICD-10-CM | POA: Diagnosis not present

## 2015-09-15 DIAGNOSIS — J449 Chronic obstructive pulmonary disease, unspecified: Secondary | ICD-10-CM | POA: Diagnosis not present

## 2015-09-16 DIAGNOSIS — J449 Chronic obstructive pulmonary disease, unspecified: Secondary | ICD-10-CM | POA: Diagnosis not present

## 2015-09-19 DIAGNOSIS — I10 Essential (primary) hypertension: Secondary | ICD-10-CM | POA: Diagnosis not present

## 2015-09-19 DIAGNOSIS — J441 Chronic obstructive pulmonary disease with (acute) exacerbation: Secondary | ICD-10-CM | POA: Diagnosis not present

## 2015-09-19 DIAGNOSIS — I7389 Other specified peripheral vascular diseases: Secondary | ICD-10-CM | POA: Diagnosis not present

## 2015-09-25 DIAGNOSIS — Z720 Tobacco use: Secondary | ICD-10-CM | POA: Diagnosis not present

## 2015-09-25 DIAGNOSIS — J449 Chronic obstructive pulmonary disease, unspecified: Secondary | ICD-10-CM | POA: Diagnosis not present

## 2015-09-25 DIAGNOSIS — E7800 Pure hypercholesterolemia, unspecified: Secondary | ICD-10-CM | POA: Diagnosis not present

## 2015-09-25 DIAGNOSIS — Z7982 Long term (current) use of aspirin: Secondary | ICD-10-CM | POA: Diagnosis not present

## 2015-09-25 DIAGNOSIS — I10 Essential (primary) hypertension: Secondary | ICD-10-CM | POA: Diagnosis not present

## 2015-09-25 DIAGNOSIS — J441 Chronic obstructive pulmonary disease with (acute) exacerbation: Secondary | ICD-10-CM | POA: Diagnosis not present

## 2015-09-25 DIAGNOSIS — K5900 Constipation, unspecified: Secondary | ICD-10-CM | POA: Diagnosis not present

## 2015-09-25 DIAGNOSIS — R109 Unspecified abdominal pain: Secondary | ICD-10-CM | POA: Diagnosis not present

## 2015-09-25 DIAGNOSIS — R1084 Generalized abdominal pain: Secondary | ICD-10-CM | POA: Diagnosis not present

## 2015-09-25 DIAGNOSIS — Z79899 Other long term (current) drug therapy: Secondary | ICD-10-CM | POA: Diagnosis not present

## 2015-10-11 DIAGNOSIS — Z780 Asymptomatic menopausal state: Secondary | ICD-10-CM | POA: Diagnosis not present

## 2015-10-11 DIAGNOSIS — J439 Emphysema, unspecified: Secondary | ICD-10-CM | POA: Diagnosis not present

## 2015-10-11 DIAGNOSIS — Z79899 Other long term (current) drug therapy: Secondary | ICD-10-CM | POA: Diagnosis not present

## 2015-10-11 DIAGNOSIS — E440 Moderate protein-calorie malnutrition: Secondary | ICD-10-CM | POA: Diagnosis not present

## 2015-10-11 DIAGNOSIS — I10 Essential (primary) hypertension: Secondary | ICD-10-CM | POA: Diagnosis not present

## 2015-10-11 DIAGNOSIS — J441 Chronic obstructive pulmonary disease with (acute) exacerbation: Secondary | ICD-10-CM | POA: Diagnosis not present

## 2015-10-11 DIAGNOSIS — Z825 Family history of asthma and other chronic lower respiratory diseases: Secondary | ICD-10-CM | POA: Diagnosis not present

## 2015-10-11 DIAGNOSIS — I2510 Atherosclerotic heart disease of native coronary artery without angina pectoris: Secondary | ICD-10-CM | POA: Diagnosis not present

## 2015-10-11 DIAGNOSIS — E872 Acidosis: Secondary | ICD-10-CM | POA: Diagnosis not present

## 2015-10-11 DIAGNOSIS — Z7982 Long term (current) use of aspirin: Secondary | ICD-10-CM | POA: Diagnosis not present

## 2015-10-11 DIAGNOSIS — R0602 Shortness of breath: Secondary | ICD-10-CM | POA: Diagnosis not present

## 2015-10-11 DIAGNOSIS — E7800 Pure hypercholesterolemia, unspecified: Secondary | ICD-10-CM | POA: Diagnosis not present

## 2015-10-11 DIAGNOSIS — Z9981 Dependence on supplemental oxygen: Secondary | ICD-10-CM | POA: Diagnosis not present

## 2015-10-11 DIAGNOSIS — I482 Chronic atrial fibrillation: Secondary | ICD-10-CM | POA: Diagnosis not present

## 2015-10-11 DIAGNOSIS — R069 Unspecified abnormalities of breathing: Secondary | ICD-10-CM | POA: Diagnosis not present

## 2015-10-11 DIAGNOSIS — Z720 Tobacco use: Secondary | ICD-10-CM | POA: Diagnosis not present

## 2015-10-11 DIAGNOSIS — I4891 Unspecified atrial fibrillation: Secondary | ICD-10-CM | POA: Diagnosis not present

## 2015-10-11 DIAGNOSIS — Z6820 Body mass index (BMI) 20.0-20.9, adult: Secondary | ICD-10-CM | POA: Diagnosis not present

## 2015-10-11 DIAGNOSIS — K5900 Constipation, unspecified: Secondary | ICD-10-CM | POA: Diagnosis not present

## 2015-10-16 DIAGNOSIS — J449 Chronic obstructive pulmonary disease, unspecified: Secondary | ICD-10-CM | POA: Diagnosis not present

## 2015-10-17 DIAGNOSIS — J449 Chronic obstructive pulmonary disease, unspecified: Secondary | ICD-10-CM | POA: Diagnosis not present

## 2015-10-24 DIAGNOSIS — J440 Chronic obstructive pulmonary disease with acute lower respiratory infection: Secondary | ICD-10-CM | POA: Diagnosis not present

## 2015-10-30 DIAGNOSIS — R0602 Shortness of breath: Secondary | ICD-10-CM | POA: Diagnosis not present

## 2015-10-30 DIAGNOSIS — R0682 Tachypnea, not elsewhere classified: Secondary | ICD-10-CM | POA: Diagnosis not present

## 2015-10-30 DIAGNOSIS — E871 Hypo-osmolality and hyponatremia: Secondary | ICD-10-CM | POA: Diagnosis not present

## 2015-10-30 DIAGNOSIS — J449 Chronic obstructive pulmonary disease, unspecified: Secondary | ICD-10-CM | POA: Diagnosis not present

## 2015-10-30 DIAGNOSIS — J441 Chronic obstructive pulmonary disease with (acute) exacerbation: Secondary | ICD-10-CM | POA: Diagnosis not present

## 2015-10-30 DIAGNOSIS — R1310 Dysphagia, unspecified: Secondary | ICD-10-CM | POA: Diagnosis not present

## 2015-10-30 DIAGNOSIS — Z780 Asymptomatic menopausal state: Secondary | ICD-10-CM | POA: Diagnosis not present

## 2015-10-30 DIAGNOSIS — I2510 Atherosclerotic heart disease of native coronary artery without angina pectoris: Secondary | ICD-10-CM | POA: Diagnosis not present

## 2015-10-30 DIAGNOSIS — E440 Moderate protein-calorie malnutrition: Secondary | ICD-10-CM | POA: Diagnosis not present

## 2015-10-30 DIAGNOSIS — J189 Pneumonia, unspecified organism: Secondary | ICD-10-CM | POA: Diagnosis not present

## 2015-10-30 DIAGNOSIS — K449 Diaphragmatic hernia without obstruction or gangrene: Secondary | ICD-10-CM | POA: Diagnosis not present

## 2015-10-30 DIAGNOSIS — J168 Pneumonia due to other specified infectious organisms: Secondary | ICD-10-CM | POA: Diagnosis not present

## 2015-10-30 DIAGNOSIS — R627 Adult failure to thrive: Secondary | ICD-10-CM | POA: Diagnosis not present

## 2015-10-30 DIAGNOSIS — I4891 Unspecified atrial fibrillation: Secondary | ICD-10-CM | POA: Diagnosis not present

## 2015-11-15 DIAGNOSIS — I10 Essential (primary) hypertension: Secondary | ICD-10-CM | POA: Diagnosis not present

## 2015-11-15 DIAGNOSIS — Z7952 Long term (current) use of systemic steroids: Secondary | ICD-10-CM | POA: Diagnosis not present

## 2015-11-15 DIAGNOSIS — I482 Chronic atrial fibrillation: Secondary | ICD-10-CM | POA: Diagnosis not present

## 2015-11-15 DIAGNOSIS — J168 Pneumonia due to other specified infectious organisms: Secondary | ICD-10-CM | POA: Diagnosis not present

## 2015-11-15 DIAGNOSIS — I4891 Unspecified atrial fibrillation: Secondary | ICD-10-CM | POA: Diagnosis not present

## 2015-11-15 DIAGNOSIS — E871 Hypo-osmolality and hyponatremia: Secondary | ICD-10-CM | POA: Diagnosis not present

## 2015-11-15 DIAGNOSIS — J189 Pneumonia, unspecified organism: Secondary | ICD-10-CM | POA: Diagnosis not present

## 2015-11-15 DIAGNOSIS — Z9981 Dependence on supplemental oxygen: Secondary | ICD-10-CM | POA: Diagnosis not present

## 2015-11-15 DIAGNOSIS — Z7901 Long term (current) use of anticoagulants: Secondary | ICD-10-CM | POA: Diagnosis not present

## 2015-11-15 DIAGNOSIS — J441 Chronic obstructive pulmonary disease with (acute) exacerbation: Secondary | ICD-10-CM | POA: Diagnosis not present

## 2015-11-15 DIAGNOSIS — J449 Chronic obstructive pulmonary disease, unspecified: Secondary | ICD-10-CM | POA: Diagnosis not present

## 2015-11-16 DIAGNOSIS — I4891 Unspecified atrial fibrillation: Secondary | ICD-10-CM | POA: Diagnosis not present

## 2015-11-16 DIAGNOSIS — T68XXXA Hypothermia, initial encounter: Secondary | ICD-10-CM | POA: Diagnosis not present

## 2015-11-16 DIAGNOSIS — R531 Weakness: Secondary | ICD-10-CM | POA: Diagnosis not present

## 2015-11-16 DIAGNOSIS — I2510 Atherosclerotic heart disease of native coronary artery without angina pectoris: Secondary | ICD-10-CM | POA: Diagnosis not present

## 2015-11-16 DIAGNOSIS — E871 Hypo-osmolality and hyponatremia: Secondary | ICD-10-CM | POA: Diagnosis not present

## 2015-11-16 DIAGNOSIS — Z79899 Other long term (current) drug therapy: Secondary | ICD-10-CM | POA: Diagnosis not present

## 2015-11-16 DIAGNOSIS — R404 Transient alteration of awareness: Secondary | ICD-10-CM | POA: Diagnosis not present

## 2015-11-16 DIAGNOSIS — I10 Essential (primary) hypertension: Secondary | ICD-10-CM | POA: Diagnosis not present

## 2015-11-16 DIAGNOSIS — E873 Alkalosis: Secondary | ICD-10-CM | POA: Diagnosis not present

## 2015-11-16 DIAGNOSIS — R918 Other nonspecific abnormal finding of lung field: Secondary | ICD-10-CM | POA: Diagnosis not present

## 2015-11-16 DIAGNOSIS — R680 Hypothermia, not associated with low environmental temperature: Secondary | ICD-10-CM | POA: Diagnosis not present

## 2015-11-16 DIAGNOSIS — I739 Peripheral vascular disease, unspecified: Secondary | ICD-10-CM | POA: Diagnosis not present

## 2015-11-16 DIAGNOSIS — Z66 Do not resuscitate: Secondary | ICD-10-CM | POA: Diagnosis not present

## 2015-11-16 DIAGNOSIS — J449 Chronic obstructive pulmonary disease, unspecified: Secondary | ICD-10-CM | POA: Diagnosis not present

## 2015-11-16 DIAGNOSIS — J9601 Acute respiratory failure with hypoxia: Secondary | ICD-10-CM | POA: Diagnosis not present

## 2015-11-16 DIAGNOSIS — K449 Diaphragmatic hernia without obstruction or gangrene: Secondary | ICD-10-CM | POA: Diagnosis not present

## 2015-11-16 DIAGNOSIS — E440 Moderate protein-calorie malnutrition: Secondary | ICD-10-CM | POA: Diagnosis not present

## 2015-11-16 DIAGNOSIS — R627 Adult failure to thrive: Secondary | ICD-10-CM | POA: Diagnosis not present

## 2015-11-16 DIAGNOSIS — Z7982 Long term (current) use of aspirin: Secondary | ICD-10-CM | POA: Diagnosis not present

## 2015-11-16 DIAGNOSIS — J9602 Acute respiratory failure with hypercapnia: Secondary | ICD-10-CM | POA: Diagnosis not present

## 2015-11-16 DIAGNOSIS — J441 Chronic obstructive pulmonary disease with (acute) exacerbation: Secondary | ICD-10-CM | POA: Diagnosis not present

## 2015-11-16 DIAGNOSIS — Z780 Asymptomatic menopausal state: Secondary | ICD-10-CM | POA: Diagnosis not present

## 2015-11-19 DIAGNOSIS — E871 Hypo-osmolality and hyponatremia: Secondary | ICD-10-CM | POA: Diagnosis not present

## 2015-11-19 DIAGNOSIS — J441 Chronic obstructive pulmonary disease with (acute) exacerbation: Secondary | ICD-10-CM | POA: Diagnosis not present

## 2015-11-19 DIAGNOSIS — Z9981 Dependence on supplemental oxygen: Secondary | ICD-10-CM | POA: Diagnosis not present

## 2015-11-19 DIAGNOSIS — I4891 Unspecified atrial fibrillation: Secondary | ICD-10-CM | POA: Diagnosis not present

## 2015-11-19 DIAGNOSIS — J189 Pneumonia, unspecified organism: Secondary | ICD-10-CM | POA: Diagnosis not present

## 2015-11-19 DIAGNOSIS — Z7952 Long term (current) use of systemic steroids: Secondary | ICD-10-CM | POA: Diagnosis not present

## 2015-11-19 DIAGNOSIS — Z7901 Long term (current) use of anticoagulants: Secondary | ICD-10-CM | POA: Diagnosis not present

## 2015-11-19 DIAGNOSIS — I10 Essential (primary) hypertension: Secondary | ICD-10-CM | POA: Diagnosis not present

## 2015-11-20 DIAGNOSIS — Z7901 Long term (current) use of anticoagulants: Secondary | ICD-10-CM | POA: Diagnosis not present

## 2015-11-20 DIAGNOSIS — I4891 Unspecified atrial fibrillation: Secondary | ICD-10-CM | POA: Diagnosis not present

## 2015-11-20 DIAGNOSIS — E871 Hypo-osmolality and hyponatremia: Secondary | ICD-10-CM | POA: Diagnosis not present

## 2015-11-20 DIAGNOSIS — J441 Chronic obstructive pulmonary disease with (acute) exacerbation: Secondary | ICD-10-CM | POA: Diagnosis not present

## 2015-11-20 DIAGNOSIS — J189 Pneumonia, unspecified organism: Secondary | ICD-10-CM | POA: Diagnosis not present

## 2015-11-20 DIAGNOSIS — Z9981 Dependence on supplemental oxygen: Secondary | ICD-10-CM | POA: Diagnosis not present

## 2015-11-20 DIAGNOSIS — I10 Essential (primary) hypertension: Secondary | ICD-10-CM | POA: Diagnosis not present

## 2015-11-20 DIAGNOSIS — Z7952 Long term (current) use of systemic steroids: Secondary | ICD-10-CM | POA: Diagnosis not present

## 2015-11-21 DIAGNOSIS — E871 Hypo-osmolality and hyponatremia: Secondary | ICD-10-CM | POA: Diagnosis not present

## 2015-11-21 DIAGNOSIS — Z7901 Long term (current) use of anticoagulants: Secondary | ICD-10-CM | POA: Diagnosis not present

## 2015-11-21 DIAGNOSIS — I10 Essential (primary) hypertension: Secondary | ICD-10-CM | POA: Diagnosis not present

## 2015-11-21 DIAGNOSIS — J189 Pneumonia, unspecified organism: Secondary | ICD-10-CM | POA: Diagnosis not present

## 2015-11-21 DIAGNOSIS — J441 Chronic obstructive pulmonary disease with (acute) exacerbation: Secondary | ICD-10-CM | POA: Diagnosis not present

## 2015-11-21 DIAGNOSIS — Z9981 Dependence on supplemental oxygen: Secondary | ICD-10-CM | POA: Diagnosis not present

## 2015-11-21 DIAGNOSIS — I4891 Unspecified atrial fibrillation: Secondary | ICD-10-CM | POA: Diagnosis not present

## 2015-11-21 DIAGNOSIS — Z7952 Long term (current) use of systemic steroids: Secondary | ICD-10-CM | POA: Diagnosis not present

## 2015-11-26 DIAGNOSIS — Z7901 Long term (current) use of anticoagulants: Secondary | ICD-10-CM | POA: Diagnosis not present

## 2015-11-26 DIAGNOSIS — Z9981 Dependence on supplemental oxygen: Secondary | ICD-10-CM | POA: Diagnosis not present

## 2015-11-26 DIAGNOSIS — J189 Pneumonia, unspecified organism: Secondary | ICD-10-CM | POA: Diagnosis not present

## 2015-11-26 DIAGNOSIS — I10 Essential (primary) hypertension: Secondary | ICD-10-CM | POA: Diagnosis not present

## 2015-11-26 DIAGNOSIS — I4891 Unspecified atrial fibrillation: Secondary | ICD-10-CM | POA: Diagnosis not present

## 2015-11-26 DIAGNOSIS — J441 Chronic obstructive pulmonary disease with (acute) exacerbation: Secondary | ICD-10-CM | POA: Diagnosis not present

## 2015-11-26 DIAGNOSIS — E871 Hypo-osmolality and hyponatremia: Secondary | ICD-10-CM | POA: Diagnosis not present

## 2015-11-26 DIAGNOSIS — Z7952 Long term (current) use of systemic steroids: Secondary | ICD-10-CM | POA: Diagnosis not present

## 2015-11-27 DIAGNOSIS — Z9981 Dependence on supplemental oxygen: Secondary | ICD-10-CM | POA: Diagnosis not present

## 2015-11-27 DIAGNOSIS — Z7902 Long term (current) use of antithrombotics/antiplatelets: Secondary | ICD-10-CM | POA: Diagnosis not present

## 2015-11-27 DIAGNOSIS — I6789 Other cerebrovascular disease: Secondary | ICD-10-CM | POA: Diagnosis not present

## 2015-11-27 DIAGNOSIS — Z7951 Long term (current) use of inhaled steroids: Secondary | ICD-10-CM | POA: Diagnosis not present

## 2015-11-27 DIAGNOSIS — Z66 Do not resuscitate: Secondary | ICD-10-CM | POA: Diagnosis not present

## 2015-11-27 DIAGNOSIS — R061 Stridor: Secondary | ICD-10-CM | POA: Diagnosis not present

## 2015-11-27 DIAGNOSIS — I10 Essential (primary) hypertension: Secondary | ICD-10-CM | POA: Diagnosis not present

## 2015-11-27 DIAGNOSIS — E7800 Pure hypercholesterolemia, unspecified: Secondary | ICD-10-CM | POA: Diagnosis not present

## 2015-11-27 DIAGNOSIS — J449 Chronic obstructive pulmonary disease, unspecified: Secondary | ICD-10-CM | POA: Diagnosis not present

## 2015-11-27 DIAGNOSIS — J441 Chronic obstructive pulmonary disease with (acute) exacerbation: Secondary | ICD-10-CM | POA: Diagnosis not present

## 2015-11-27 DIAGNOSIS — Z79899 Other long term (current) drug therapy: Secondary | ICD-10-CM | POA: Diagnosis not present

## 2015-11-27 DIAGNOSIS — I739 Peripheral vascular disease, unspecified: Secondary | ICD-10-CM | POA: Diagnosis not present

## 2015-11-28 DIAGNOSIS — R279 Unspecified lack of coordination: Secondary | ICD-10-CM | POA: Diagnosis not present

## 2015-11-28 DIAGNOSIS — Z7401 Bed confinement status: Secondary | ICD-10-CM | POA: Diagnosis not present

## 2015-12-19 DEATH — deceased
